# Patient Record
Sex: Female | Born: 1955 | Race: White | Hispanic: No | Smoking: Current every day smoker
Health system: Southern US, Community
[De-identification: ages and names within clinical notes are randomized; demographics above are authoritative.]

## PROBLEM LIST (undated history)

## (undated) DIAGNOSIS — K219 Gastro-esophageal reflux disease without esophagitis: Secondary | ICD-10-CM

## (undated) DIAGNOSIS — E7800 Pure hypercholesterolemia, unspecified: Secondary | ICD-10-CM

## (undated) DIAGNOSIS — C801 Malignant (primary) neoplasm, unspecified: Secondary | ICD-10-CM

## (undated) HISTORY — PX: CERVICAL SPINE SURGERY: SHX589

## (undated) HISTORY — PX: CHOLECYSTECTOMY: SHX55

## (undated) HISTORY — PX: ULNAR NERVE REPAIR: SHX2594

---

## 1997-07-28 HISTORY — PX: ABDOMINAL HYSTERECTOMY: SHX81

## 1997-08-15 ENCOUNTER — Other Ambulatory Visit: Admission: RE | Admit: 1997-08-15 | Discharge: 1997-08-15 | Payer: Self-pay | Admitting: Obstetrics and Gynecology

## 1998-01-17 ENCOUNTER — Ambulatory Visit (HOSPITAL_BASED_OUTPATIENT_CLINIC_OR_DEPARTMENT_OTHER): Admission: RE | Admit: 1998-01-17 | Discharge: 1998-01-17 | Payer: Self-pay | Admitting: Orthopaedic Surgery

## 2000-08-29 ENCOUNTER — Other Ambulatory Visit: Admission: RE | Admit: 2000-08-29 | Discharge: 2000-08-29 | Payer: Self-pay | Admitting: Dermatology

## 2001-02-19 ENCOUNTER — Encounter: Payer: Self-pay | Admitting: Obstetrics and Gynecology

## 2001-02-19 ENCOUNTER — Ambulatory Visit (HOSPITAL_COMMUNITY): Admission: RE | Admit: 2001-02-19 | Discharge: 2001-02-19 | Payer: Self-pay | Admitting: Obstetrics and Gynecology

## 2001-03-13 ENCOUNTER — Ambulatory Visit (HOSPITAL_COMMUNITY): Admission: RE | Admit: 2001-03-13 | Discharge: 2001-03-13 | Payer: Self-pay | Admitting: Obstetrics and Gynecology

## 2001-03-13 ENCOUNTER — Encounter: Payer: Self-pay | Admitting: Obstetrics and Gynecology

## 2002-02-19 ENCOUNTER — Ambulatory Visit (HOSPITAL_COMMUNITY): Admission: RE | Admit: 2002-02-19 | Discharge: 2002-02-19 | Payer: Self-pay | Admitting: Obstetrics and Gynecology

## 2002-02-19 ENCOUNTER — Encounter: Payer: Self-pay | Admitting: Obstetrics and Gynecology

## 2004-03-11 ENCOUNTER — Emergency Department (HOSPITAL_COMMUNITY): Admission: EM | Admit: 2004-03-11 | Discharge: 2004-03-11 | Payer: Self-pay | Admitting: Emergency Medicine

## 2005-06-11 ENCOUNTER — Ambulatory Visit (HOSPITAL_COMMUNITY): Admission: RE | Admit: 2005-06-11 | Discharge: 2005-06-11 | Payer: Self-pay | Admitting: Family Medicine

## 2005-09-24 ENCOUNTER — Ambulatory Visit (HOSPITAL_COMMUNITY): Admission: RE | Admit: 2005-09-24 | Discharge: 2005-09-24 | Payer: Self-pay | Admitting: Family Medicine

## 2006-07-01 ENCOUNTER — Ambulatory Visit (HOSPITAL_COMMUNITY): Admission: RE | Admit: 2006-07-01 | Discharge: 2006-07-01 | Payer: Self-pay | Admitting: Family Medicine

## 2006-07-05 ENCOUNTER — Ambulatory Visit (HOSPITAL_COMMUNITY): Admission: RE | Admit: 2006-07-05 | Discharge: 2006-07-05 | Payer: Self-pay | Admitting: Family Medicine

## 2006-07-17 ENCOUNTER — Inpatient Hospital Stay (HOSPITAL_COMMUNITY): Admission: RE | Admit: 2006-07-17 | Discharge: 2006-07-18 | Payer: Self-pay | Admitting: Neurosurgery

## 2007-06-15 ENCOUNTER — Ambulatory Visit (HOSPITAL_COMMUNITY): Admission: RE | Admit: 2007-06-15 | Discharge: 2007-06-15 | Payer: Self-pay | Admitting: Otolaryngology

## 2007-07-09 ENCOUNTER — Ambulatory Visit (HOSPITAL_COMMUNITY): Admission: RE | Admit: 2007-07-09 | Discharge: 2007-07-09 | Payer: Self-pay | Admitting: Family Medicine

## 2007-07-20 ENCOUNTER — Ambulatory Visit (HOSPITAL_COMMUNITY): Admission: RE | Admit: 2007-07-20 | Discharge: 2007-07-20 | Payer: Self-pay | Admitting: Family Medicine

## 2007-08-02 ENCOUNTER — Emergency Department (HOSPITAL_COMMUNITY): Admission: EM | Admit: 2007-08-02 | Discharge: 2007-08-02 | Payer: Self-pay | Admitting: Emergency Medicine

## 2008-06-07 ENCOUNTER — Ambulatory Visit (HOSPITAL_COMMUNITY): Admission: RE | Admit: 2008-06-07 | Discharge: 2008-06-07 | Payer: Self-pay | Admitting: Family Medicine

## 2008-08-01 ENCOUNTER — Emergency Department (HOSPITAL_COMMUNITY): Admission: EM | Admit: 2008-08-01 | Discharge: 2008-08-01 | Payer: Self-pay | Admitting: Emergency Medicine

## 2008-10-27 HISTORY — PX: COLONOSCOPY: SHX174

## 2008-11-08 ENCOUNTER — Ambulatory Visit (HOSPITAL_COMMUNITY): Admission: RE | Admit: 2008-11-08 | Discharge: 2008-11-08 | Payer: Self-pay | Admitting: Family Medicine

## 2008-11-11 ENCOUNTER — Encounter: Payer: Self-pay | Admitting: Gastroenterology

## 2008-11-11 ENCOUNTER — Ambulatory Visit (HOSPITAL_COMMUNITY): Admission: RE | Admit: 2008-11-11 | Discharge: 2008-11-11 | Payer: Self-pay | Admitting: Family Medicine

## 2008-11-15 ENCOUNTER — Telehealth (INDEPENDENT_AMBULATORY_CARE_PROVIDER_SITE_OTHER): Payer: Self-pay

## 2008-11-21 ENCOUNTER — Encounter: Payer: Self-pay | Admitting: Gastroenterology

## 2008-11-21 ENCOUNTER — Ambulatory Visit (HOSPITAL_COMMUNITY): Admission: RE | Admit: 2008-11-21 | Discharge: 2008-11-21 | Payer: Self-pay | Admitting: Gastroenterology

## 2008-11-21 ENCOUNTER — Ambulatory Visit: Payer: Self-pay | Admitting: Gastroenterology

## 2009-01-26 ENCOUNTER — Ambulatory Visit (HOSPITAL_COMMUNITY): Admission: RE | Admit: 2009-01-26 | Discharge: 2009-01-26 | Payer: Self-pay | Admitting: Family Medicine

## 2009-02-06 ENCOUNTER — Inpatient Hospital Stay (HOSPITAL_COMMUNITY): Admission: RE | Admit: 2009-02-06 | Discharge: 2009-02-09 | Payer: Self-pay | Admitting: General Surgery

## 2009-02-06 ENCOUNTER — Encounter (INDEPENDENT_AMBULATORY_CARE_PROVIDER_SITE_OTHER): Payer: Self-pay | Admitting: General Surgery

## 2010-03-01 ENCOUNTER — Ambulatory Visit (HOSPITAL_COMMUNITY): Admission: RE | Admit: 2010-03-01 | Payer: Self-pay | Admitting: Family Medicine

## 2010-05-19 ENCOUNTER — Encounter: Payer: Self-pay | Admitting: Family Medicine

## 2010-08-02 LAB — HEPATIC FUNCTION PANEL
ALT: 27 U/L (ref 0–35)
AST: 33 U/L (ref 0–37)
Bilirubin, Direct: 0.2 mg/dL (ref 0.0–0.3)
Indirect Bilirubin: 0.6 mg/dL (ref 0.3–0.9)
Total Bilirubin: 0.8 mg/dL (ref 0.3–1.2)
Total Protein: 6 g/dL (ref 6.0–8.3)

## 2010-08-02 LAB — CBC
Hemoglobin: 13 g/dL (ref 12.0–15.0)
MCV: 89.3 fL (ref 78.0–100.0)
Platelets: 148 10*3/uL — ABNORMAL LOW (ref 150–400)

## 2010-08-02 LAB — BASIC METABOLIC PANEL
BUN: 8 mg/dL (ref 6–23)
CO2: 24 mEq/L (ref 19–32)
Creatinine, Ser: 0.59 mg/dL (ref 0.4–1.2)
GFR calc Af Amer: >60 mL/min (ref >60)
GFR calc non Af Amer: >60 mL/min (ref >60)
Glucose, Bld: 97 mg/dL (ref 70–99)
Sodium: 138 mEq/L (ref 135–145)

## 2010-08-02 LAB — DIFFERENTIAL
Basophils Relative: 0 % (ref 0–1)
Lymphocytes Relative: 10 % — ABNORMAL LOW (ref 12–46)
Monocytes Absolute: 0.7 10*3/uL (ref 0.1–1.0)
Neutro Abs: 8.4 10*3/uL — ABNORMAL HIGH (ref 1.7–7.7)

## 2010-09-11 NOTE — Op Note (Signed)
NAME:  Tara Wong, Tara Wong NO.:  [id number]   MEDICAL RECORD NO.:  [id number]          PATIENT TYPE:  AMB   LOCATION:  DAY                           FACILITY:  [facility]   PHYSICIAN:  Kassie Mends, M.D.      DATE OF BIRTH:  1956/03/23   DATE OF PROCEDURE:  11/21/2008  DATE OF DISCHARGE:                                PROCEDURE NOTE   REFERRING Sally Reimers:  Tara Bernard, MD   PROCEDSURE:  Colonoscopy with cold forceps polypectomy   INDICATION FOR EXAM:  Tara Wong is a 55 year old female who presents  with intermittent left lower quadrant pain and constipation.  She never  had a colonoscopy.   FINDINGS:  1. A 3-mm sessile rectal polyp removed via cold forceps.  Otherwise,      no polyps, masses, inflammatory changes, diverticula, or AVMs.  2. Moderate internal hemorrhoids.  Otherwise, normal retroflex view of      the rectum.   RECOMMENDATIONS:  1. Screening colonoscopy in 10 years if she has hyperplastic polyp or      simple adenoma.  2. She should follow a high-fiber diet.  She is given a handout on      high-fiber diet, polyps and hemorrhoids.  She should drink 6-8 cups      of water daily.  3. No aspirin, NSAIDs, or anticoagulation for 5 days.  4. If she continues to have problems with abdominal pain and      constipation, then we would be more than happy to see her in the      office for an evaluation.  5. Will call her with the results of her biopsy.   MEDICATIONS:  1. Demerol 75 mg IV.  2. Versed 5 mg IV.   PROCEDURE TECHNIQUE:  Physical exam was performed.  Informed consent was  obtained from the patient after explaining the benefits, risks and  alternatives to the procedure.  The patient was clinically monitored and  placed in left lateral position.  Continuous oxygen was provided by  nasal cannula, IV medicine administered through an indwelling cannula.  After administration of sedation and rectal exam, the patient's rectum  was intubated and the  scope was advanced under direct stabilization to  the cecum.  The scope was removed slowly by carefully examining  the color, texture, anatomy and integrity of the mucosa on the way out.  The patient was recovered in endoscopy and discharged home in  satisfactory condition.   PATH:  HYPERPLASTIC POLYPS      Kassie Mends, M.D.  Electronically Signed     SM/MEDQ  D:  11/21/2008  T:  11/21/2008  Job:  [id number]   cc:   Tara Wong, M.D.  Fax: [phone]

## 2010-09-14 NOTE — Op Note (Signed)
NAMEMAELYS, Tara Wong NO.:  [id number]   MEDICAL RECORD NO.:  [id number]          PATIENT TYPE:  INP   LOCATION:  2899                         FACILITY:  [facility]   PHYSICIAN:  Danae Orleans. Venetia Maxon, M.D.  DATE OF BIRTH:  January 16, 1956   DATE OF PROCEDURE:  07/17/2006  DATE OF DISCHARGE:                               OPERATIVE REPORT   PREOPERATIVE DIAGNOSIS:  1. Herniated cervical disk with spondylosis.  2. Degenerative disease.  3. Radiculopathy C5-6 and C6-7 levels.   POSTOPERATIVE DIAGNOSIS:  1. Herniated cervical disk with spondylosis.  2. Degenerative disease.  3. Radiculopathy C5-6 and C6-7 levels.   PROCEDURE:  Anterior cervical decompression and fusion C5-6 and C6-7  levels with PEEK interbody cages and morcellized bone autograft,  anterior cervical plate.   SURGEON:  Danae Orleans. Venetia Maxon, M.D.   ASSISTANT:  Clydene Fake, M.D.   ANESTHESIA:  General endotracheal anesthesia.   BLOOD LOSS:  Minimal.   COMPLICATIONS:  None.   DISPOSITION:  Recovery.   INDICATIONS:  Joslynn Jamroz is a 55 year old woman with significant left  arm pain and weakness with a large amount of spondylitic foraminal  stenosis and canal compromise of the C5-6 and C6-7 levels.  It was  elected take her to surgery for anterior cervical decompression and  fusion.   DESCRIPTION OF PROCEDURE:  Ms. Cressler was brought to the operating  room.  Following the satisfactory and uncomplicated induction of general  endotracheal anesthesia, and placement of intravenous lines, the patient  was placed in the supine position on the operating table.  Her neck was  placed in slight extension.  She was placed in 5 pounds of halter  traction.  Her anterior neck was then prepped and draped in the usual  sterile fashion using DuraPrep.  Area of planned incision was  infiltrated with 1/4% Marcaine and lidocaine and 1:200,000 epinephrine  and incision was made from the midline to the anterior border of  the  sternocleidomastoid muscle.  It was carried sharply through platysma  layer.  Subplatysmal dissection was performed exposing the anterior  border of the sternocleidomastoid muscle.  Using blunt dissection the  carotid sheath was kept lateral and trachea and esophagus kept medial  exposing the anterior cervical spine.  A bent spinal needle was placed  subsequently at the C5-6 level and this was confirmed on intraoperative  x-ray.   Subsequently the longus colli muscles were taken down from the anterior  cervical spine from C5-C7 using electrocautery and Key elevator.  A self-  retaining shadow line retractor was placed facilitating exposure along  with up-and-down retractor.  Subsequently the interspace at C5-6 and C6-  7 were incised.  Degenerated osteophytic material was removed.  The disk  space was cleared of disk material.  The endplates were then  decorticated with a high-speed drill as were the uncinate spurs  bilaterally. The bone removed with these maneuvers were saved for later  use in bone grafting.  Initially at the C5-6 level there was a large  osteophyte which was directly compressing the spinal cord dura.  This  was  very carefully decompressed; and the osteophytic material was  removed.  There was significant foraminal compression of the C6 nerve  root on the left which had a relatively vertical take off.  The nerve  root was decompressed as was the right-sided nerve root.  Hemostasis was  assured with Gelfoam-soaked thrombin; and after a trial sizing a medium  6-mm PEEK interbody cage was selected, packed with morcellized bone  autograft, inserted in the interspace, and countersunk appropriately.   Attention was then turned to the C6-7 level where similar decompression  was performed.  Again the bone removed was saved for later use in bone  grafting.  The endplates were decorticated and uncinate spurs drilled  down.  Subsequently using a 2-and-3-mm millimeter gold  tip Kerrison  rongeurs.  Both the cervical and spinal cord dura as well as the C7  nerve roots were decompressed.  Hemostasis, again, assured and  subsequently a 7-mm PEEK interbody cage was selected, packed with  morcellized bone autograft, inserted in the interspace, and countersunk  appropriately.   A 32-mm trestle, anterior cervical plate was then affixed to the  anterior cervical spine using variable angle 14-mm screws; two at C5,  two at C6, and two at C7.  All screws had excellent purchase.  Locking  mechanisms were engaged.  Final x-ray demonstrated a well-positioned  interbody grafts in the anterior cervical plate.  The wound was  copiously irrigated with bacitracin saline.  Soft tissues were inspected  and found to be in good repair.  Hemostasis was assured.   The platysma layer was closed with 3-0 Vicryl sutures.  The skin edges  were approximated with 3-0 Vicryl interrupted inverted sutures.  The  wound was dressed with Dermabond.  The patient was x-rayed in the  operating room and taken to the recovery room in stable satisfactory  condition; having tolerated the operation well.  Counts were correct at  the end of the case.      Danae Orleans. Venetia Maxon, M.D.  Electronically Signed     JDS/MEDQ  D:  07/17/2006  T:  07/17/2006  Job:  [id number]

## 2012-06-02 ENCOUNTER — Ambulatory Visit (HOSPITAL_COMMUNITY)
Admission: RE | Admit: 2012-06-02 | Discharge: 2012-06-02 | Disposition: A | Discharge status: Home / Self Care | Payer: BC Managed Care – PPO | Source: Ambulatory Visit | Attending: Family Medicine | Admitting: Family Medicine

## 2012-06-02 ENCOUNTER — Other Ambulatory Visit: Payer: Self-pay | Admitting: Family Medicine

## 2012-06-02 DIAGNOSIS — F17200 Nicotine dependence, unspecified, uncomplicated: Secondary | ICD-10-CM

## 2012-06-02 DIAGNOSIS — R079 Chest pain, unspecified: Secondary | ICD-10-CM | POA: Insufficient documentation

## 2012-06-25 ENCOUNTER — Encounter (HOSPITAL_COMMUNITY): Payer: Self-pay | Admitting: *Deleted

## 2012-06-25 ENCOUNTER — Emergency Department (HOSPITAL_COMMUNITY)
Admission: EM | Admit: 2012-06-25 | Discharge: 2012-06-25 | Disposition: A | Discharge status: Home / Self Care | Payer: BC Managed Care – PPO | Attending: Emergency Medicine | Admitting: Emergency Medicine

## 2012-06-25 DIAGNOSIS — K1120 Sialoadenitis, unspecified: Secondary | ICD-10-CM | POA: Insufficient documentation

## 2012-06-25 DIAGNOSIS — H00014 Hordeolum externum left upper eyelid: Secondary | ICD-10-CM

## 2012-06-25 DIAGNOSIS — Z8719 Personal history of other diseases of the digestive system: Secondary | ICD-10-CM | POA: Insufficient documentation

## 2012-06-25 DIAGNOSIS — H00019 Hordeolum externum unspecified eye, unspecified eyelid: Secondary | ICD-10-CM | POA: Insufficient documentation

## 2012-06-25 DIAGNOSIS — F17200 Nicotine dependence, unspecified, uncomplicated: Secondary | ICD-10-CM | POA: Insufficient documentation

## 2012-06-25 HISTORY — DX: Malignant (primary) neoplasm, unspecified: C80.1

## 2012-06-25 HISTORY — DX: Gastro-esophageal reflux disease without esophagitis: K21.9

## 2012-06-25 MED ORDER — AMOXICILLIN-POT CLAVULANATE 875-125 MG PO TABS: 1.0000 | ORAL_TABLET | Freq: Two times a day (BID) | ORAL | Status: DC

## 2012-06-25 NOTE — ED Notes (Addendum)
Swelling , tenderness rt side of face,  Started z-pack and eye drops today for a stye.  Tingling of lips

## 2012-06-25 NOTE — ED Provider Notes (Signed)
History     CSN: [id number]  Arrival date & time 06/25/12  1652   First MD Initiated Contact with Patient 06/25/12 2007      Chief Complaint  Patient presents with  . Facial Swelling   HPI Pt was seen at 2025.  Per pt, c/o gradual onset and persistence of waxing and waning right sided face "pain" and "swelling" that began tonight PTA.  Pt states her symptoms began while she was eating.  States "it just started to swell."  States the swelling improved shortly after she stopped eating, and began again when she ate a snack while in the ED waiting room.  Pt states the swelling is starting to improve now.  States she was eval by her PMD Luking today for a "stye" on her left eyelid, started on zithromax and eye drops.  Pt is concerned that this may be a medication reaction.  Denies SOB/wheezing, no sore throat, no intra-oral edema, no intra-oral drainage/swelling, no fevers, no facial rash, no body rash, no eye pain, no eye drainage.     Past Medical History  Diagnosis Date  . GERD (gastroesophageal reflux disease)     Past Surgical History  Procedure Laterality Date  . Abdominal hysterectomy    . Cervical spine surgery    . Cholecystectomy      History  Substance Use Topics  . Smoking status: Current Every Day Smoker  . Smokeless tobacco: Not on file  . Alcohol Use: No      Review of Systems ROS: Statement: All systems negative except as marked or noted in the HPI; Constitutional: Negative for fever and chills. ; ; Eyes: Negative for eye pain, redness and discharge. ; ; ENMT: Negative for ear pain, hoarseness, nasal congestion, sinus pressure and sore throat. ; ; Cardiovascular: Negative for chest pain, palpitations, diaphoresis, dyspnea and peripheral edema. ; ; Respiratory: Negative for cough, wheezing and stridor. ; ; Gastrointestinal: Negative for nausea, vomiting, diarrhea, abdominal pain, blood in stool, hematemesis, jaundice and rectal bleeding. . ; ; Genitourinary: Negative  for dysuria, flank pain and hematuria. ; ; Musculoskeletal: Negative for back pain and neck pain. Negative for swelling and trauma.; ; Skin: +right face swelling. Negative for pruritus, rash, abrasions, blisters, bruising and skin lesion.; ; Neuro: Negative for headache, lightheadedness and neck stiffness. Negative for weakness, altered level of consciousness , altered mental status, extremity weakness, paresthesias, involuntary movement, seizure and syncope.       Allergies  Review of patient's allergies indicates no known allergies.  Home Medications   Current Outpatient Rx  Name  Route  Sig  Dispense  Refill  . amoxicillin-clavulanate (AUGMENTIN) 875-125 MG per tablet   Oral   Take 1 tablet by mouth every 12 (twelve) hours.   20 tablet   0     BP 122/68  Pulse 81  Temp(Src) 98.2 F (36.8 C) (Oral)  Resp 20  Ht 5\' 8"  (1.727 m)  Wt 152 lb (68.947 kg)  BMI 23.12 kg/m2  SpO2 100%  Physical Exam 2030: Physical examination:  Nursing notes reviewed; Vital signs and O2 SAT reviewed;  Constitutional: Well developed, Well nourished, Well hydrated, In no acute distress; Head:  Normocephalic, atraumatic; Eyes: EOMI, PERRL, No scleral icterus. No conjunctival injection, no drainage. +small hordeolum left upper eyelid margin; ENMT: TM's clear bilat. +edemetous nasal turbinates bilat with clear rhinorrhea. +right parotid area with localized mild edema and tenderness to palp. No overlying erythema. No intra-oral drainage from right duct (spontaneous or able  to be manually expressed). Mouth and pharynx without lesions. No tonsillar exudates. No intra-oral edema. No submandibular or sublingual edema. No hoarse voice, no drooling, no stridor. No pain with manipulation of larynx. Mouth and pharynx normal, Mucous membranes moist; Neck: Supple, Full range of motion, No lymphadenopathy; Cardiovascular: Regular rate and rhythm, No murmur, rub, or gallop; Respiratory: Breath sounds clear & equal  bilaterally, No rales, rhonchi, wheezes.  Speaking full sentences with ease, Normal respiratory effort/excursion; Chest: Nontender, Movement normal; Abdomen: Soft, Nontender, Nondistended, Normal bowel sounds;; Extremities: Pulses normal, No tenderness, No edema, No calf edema or asymmetry.; Neuro: AA&Ox3, Major CN grossly intact. No facial droop.  Speech clear. Climbs on and off stretcher easily by herself. No gross focal motor or sensory deficits in extremities.; Skin: Color normal, Warm, Dry, no rash, no hives, no petechiae.   ED Course  Procedures     MDM  MDM Reviewed: nursing note and vitals    2100:  Pt states symptoms began, and reoccur, when she eats.  Right parotid mildly edematous with localized TTP.  No facial erythema.  No drainage or obvious salivary stone able to be expressed from inside right cheek. Afebrile with stable VS.  Appears localized parotitis/sialadenitis, will have pt d/c zithromax and tx with augmentin. Pt and family concerned this is "a reaction to the zithromax."  No hives, no rash, no intra-oral edema.  Reassured.  Pt and family want to go home now.  Pt will continue her eye meds for left lid hordeolum.  Dx d/w pt and family.  Questions answered.  Verb understanding, agreeable to d/c home with outpt f/u.        Laray Anger, DO 06/28/12 1739

## 2012-06-26 ENCOUNTER — Encounter (HOSPITAL_COMMUNITY): Payer: Self-pay | Admitting: Emergency Medicine

## 2012-07-15 ENCOUNTER — Encounter: Payer: Self-pay | Admitting: *Deleted

## 2012-07-15 DIAGNOSIS — E785 Hyperlipidemia, unspecified: Secondary | ICD-10-CM | POA: Insufficient documentation

## 2012-07-15 DIAGNOSIS — R32 Unspecified urinary incontinence: Secondary | ICD-10-CM

## 2012-07-15 DIAGNOSIS — K219 Gastro-esophageal reflux disease without esophagitis: Secondary | ICD-10-CM | POA: Insufficient documentation

## 2012-07-16 ENCOUNTER — Ambulatory Visit (INDEPENDENT_AMBULATORY_CARE_PROVIDER_SITE_OTHER): Payer: BC Managed Care – PPO | Admitting: Nurse Practitioner

## 2012-07-16 ENCOUNTER — Encounter: Payer: Self-pay | Admitting: Nurse Practitioner

## 2012-07-16 VITALS — BP 105/70 | Temp 98.2°F | Wt 154.0 lb

## 2012-07-16 DIAGNOSIS — L309 Dermatitis, unspecified: Secondary | ICD-10-CM

## 2012-07-16 MED ORDER — CLOTRIMAZOLE-BETAMETHASONE 1-0.05 % EX CREA: TOPICAL_CREAM | Freq: Two times a day (BID) | CUTANEOUS | Status: DC

## 2012-07-16 NOTE — Progress Notes (Signed)
Subjective: Presents complaints of a flareup of her rash on her arms. Has had a similar problem in the past in 2011. No changes in her hygiene products. Pruritic at times. No known allergens. No fever. No other rash. Objective: NAD. Alert, oriented. Several discrete slightly raised dry faint pink patches noted in the axillary area. Skin in general is slightly dry. Assessment: #1 eczema #2 possible secondary fungal infection Plan: #1 Lotrisone cream twice a day to affected area when necessary up to 2 weeks. Call back if worsens or persists. [facility]

## 2012-07-16 NOTE — Patient Instructions (Signed)
Recommend Cetaphil cream for dry skin.  Call back if rash worsens or persists.

## 2012-08-17 ENCOUNTER — Ambulatory Visit: Payer: BC Managed Care – PPO | Admitting: Family Medicine

## 2013-02-18 ENCOUNTER — Ambulatory Visit (INDEPENDENT_AMBULATORY_CARE_PROVIDER_SITE_OTHER): Payer: BC Managed Care – PPO | Admitting: *Deleted

## 2013-02-18 DIAGNOSIS — Z23 Encounter for immunization: Secondary | ICD-10-CM

## 2013-04-25 IMAGING — CR DG CHEST 2V
2 series · 2 of 2 positions shown · non-contrast
Comparison: Chest x-ray of 11/08/2008

CLINICAL DATA: Left-sided chest pain, smoking history

CHEST - 2 VIEW

[view not recorded (1 of 2)]
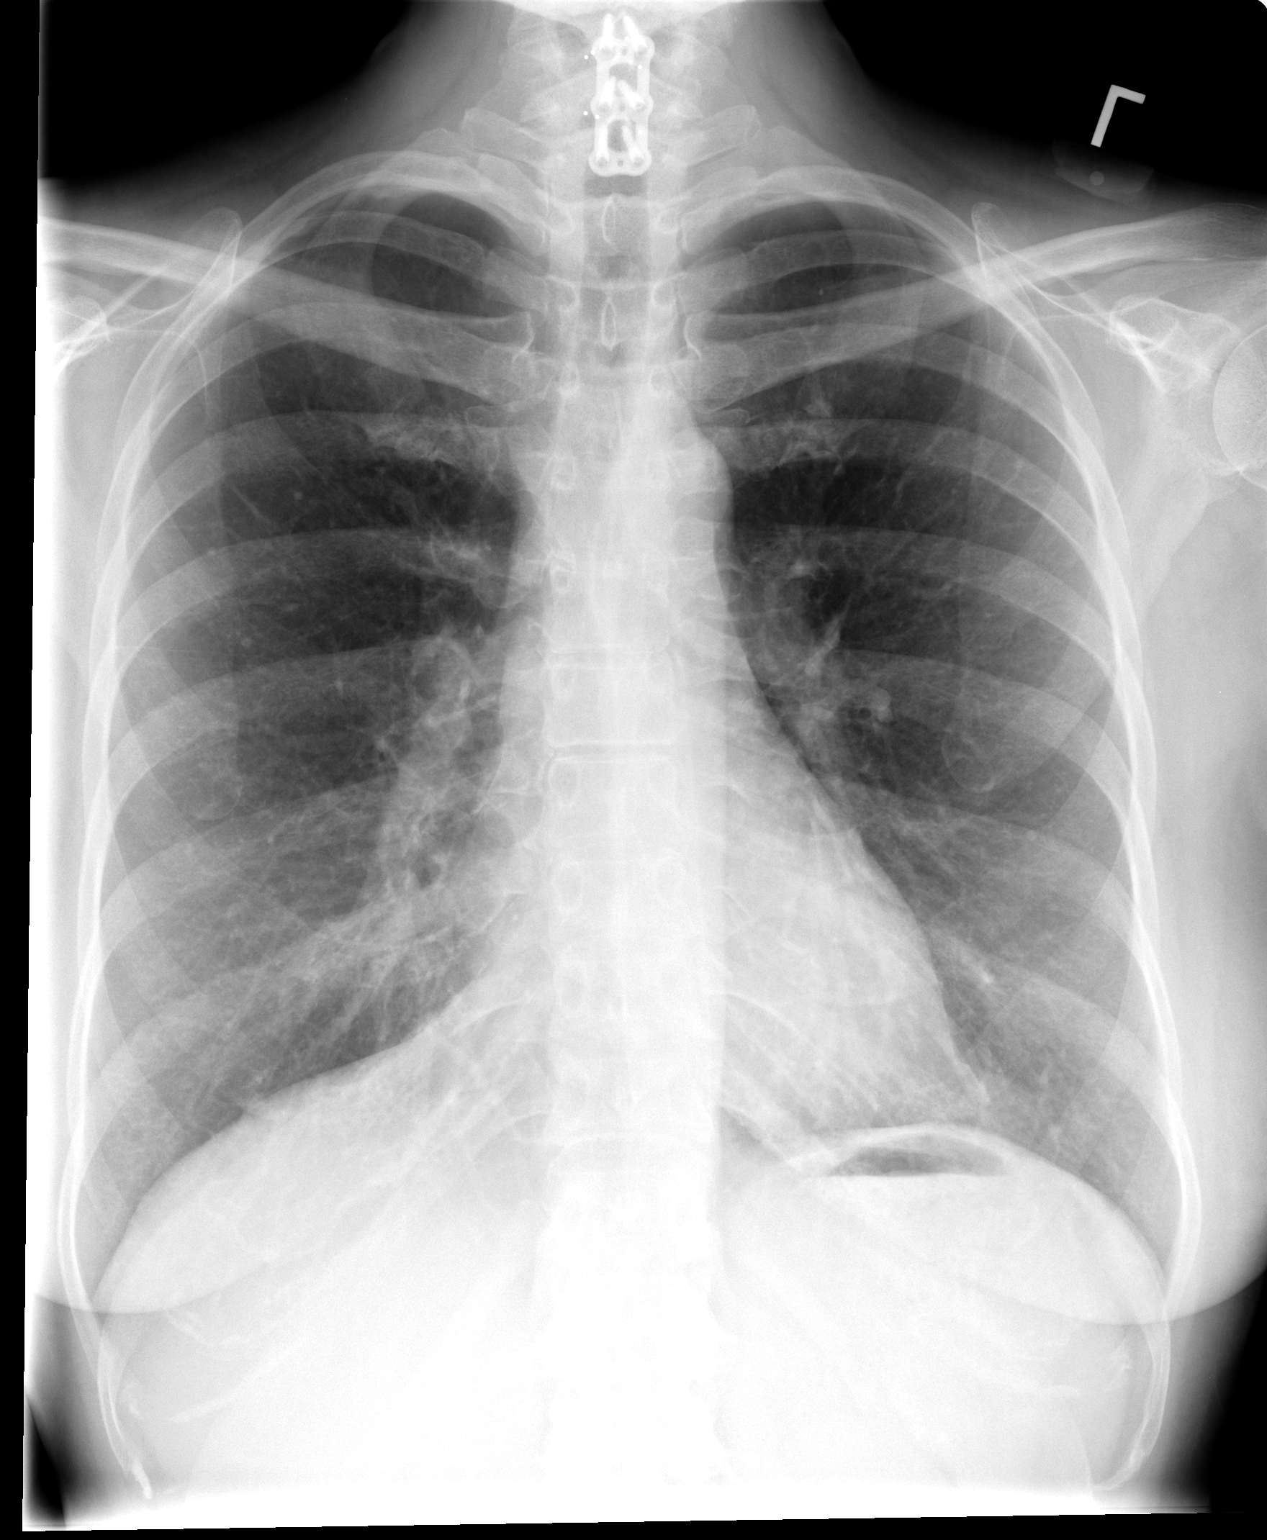

[view not recorded (2 of 2)]
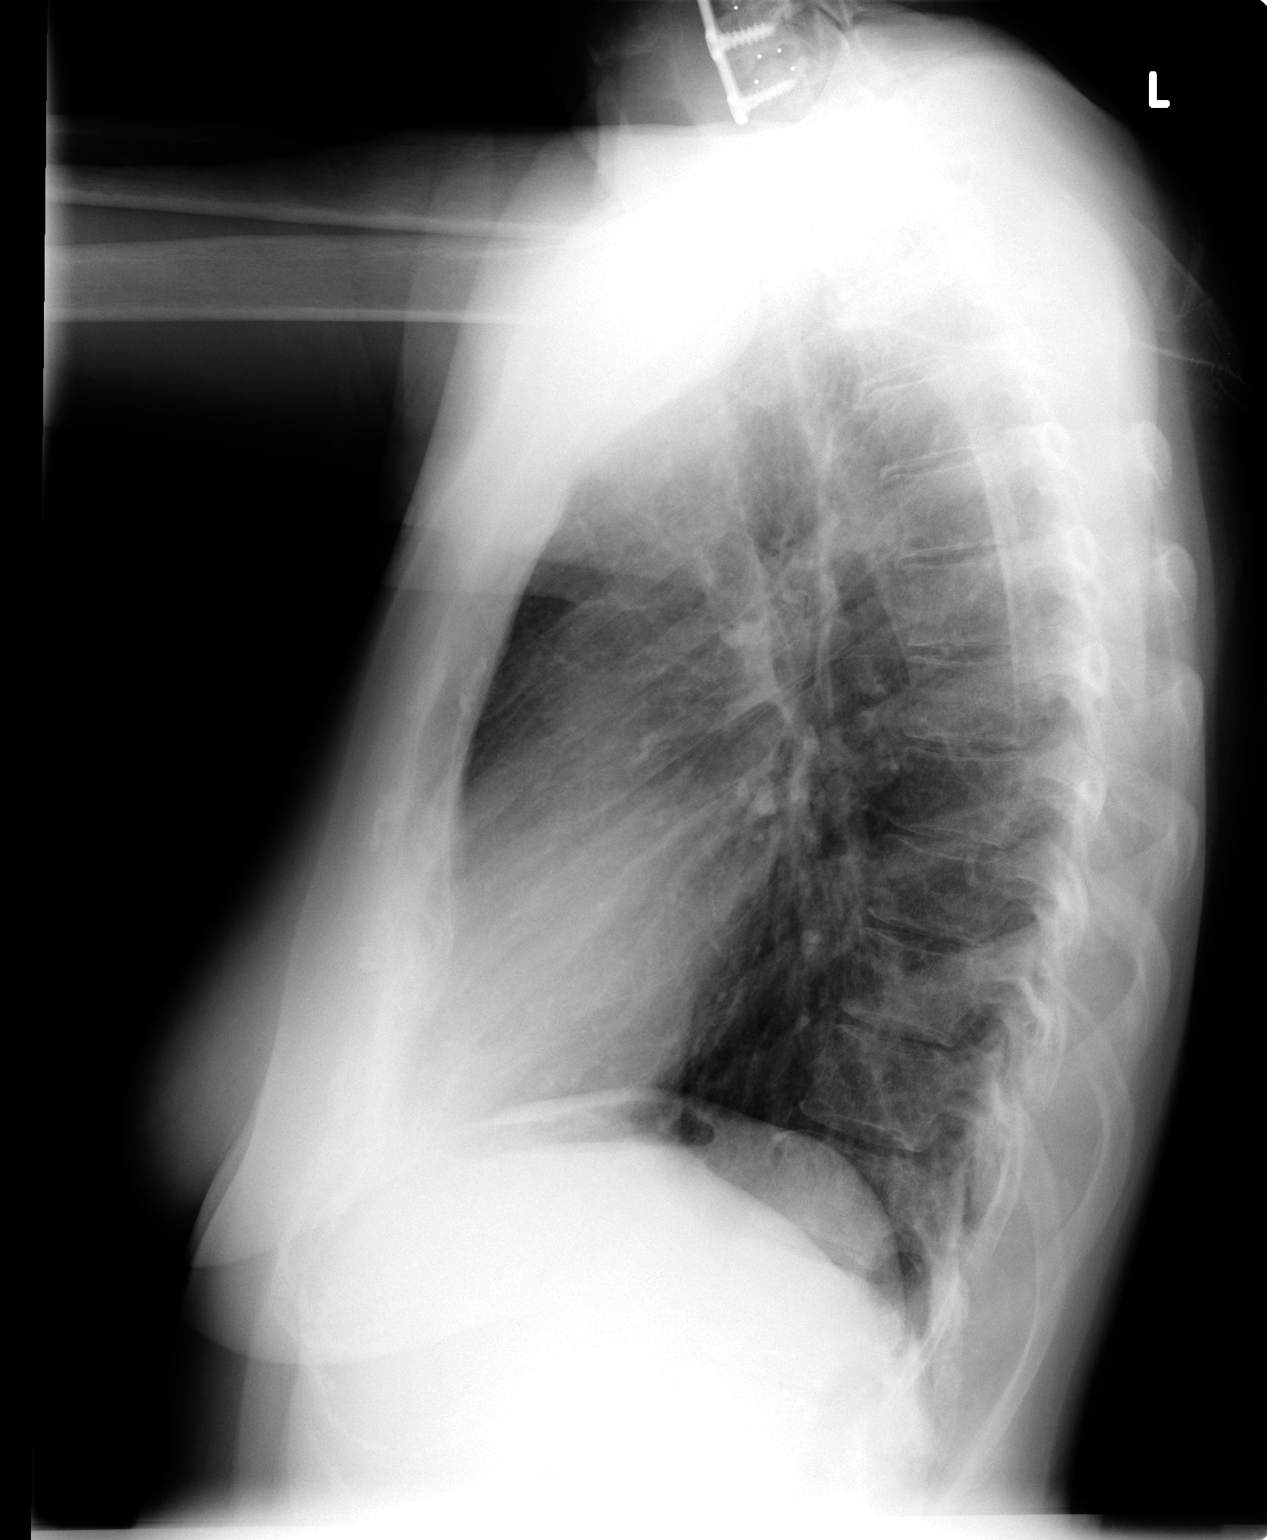

[2 of 2 positions shown; findings below may reference images not displayed]

FINDINGS: No active infiltrate or effusion is seen.  Mediastinal
contours appear normal.  Mild peribronchial thickening is seen
which may indicate bronchitis in this patient with smoking history.
The heart is within normal limits in size.  No skeletal abnormality
is noted.  A lower anterior cervical spine fusion plate is present.
IMPRESSION: No active lung disease.  Mild peribronchial thickening.

## 2013-06-02 ENCOUNTER — Encounter: Payer: Self-pay | Admitting: Family Medicine

## 2013-06-02 ENCOUNTER — Ambulatory Visit (INDEPENDENT_AMBULATORY_CARE_PROVIDER_SITE_OTHER): Payer: BC Managed Care – PPO | Admitting: Family Medicine

## 2013-06-02 VITALS — BP 100/70 | Temp 98.5°F | Ht 68.0 in | Wt 164.0 lb

## 2013-06-02 DIAGNOSIS — J329 Chronic sinusitis, unspecified: Secondary | ICD-10-CM

## 2013-06-02 DIAGNOSIS — J310 Chronic rhinitis: Secondary | ICD-10-CM

## 2013-06-02 MED ORDER — AMOXICILLIN-POT CLAVULANATE 875-125 MG PO TABS: 1.0000 | ORAL_TABLET | Freq: Two times a day (BID) | ORAL | Status: AC

## 2013-06-02 NOTE — Progress Notes (Signed)
   Subjective:    Patient ID: Tara Wong, female    DOB: 04-06-1956, 58 y.o.   MRN: [id number]  URI  This is a new problem. The current episode started in the past 7 days. The problem has been unchanged. Maximum temperature: low grade fever. Associated symptoms include congestion, coughing, diarrhea, headaches and a sore throat. Associated symptoms comments: Body aches. She has tried NSAIDs and decongestant for the symptoms. The treatment provided no relief.   Head aching sinus area  Felt like had fever  Body aches appetite ok  Tired   Some diearrhea  Sore throat intermittently   Review of Systems  HENT: Positive for congestion and sore throat.   Respiratory: Positive for cough.   Gastrointestinal: Positive for diarrhea.  Neurological: Positive for headaches.       Objective:   Physical Exam  Alert moderate malaise. H&T moderate his congestion frontal tenderness pharynx normal neck supple lungs some rhonchi no wheezes heart rare in rhythm.      Assessment & Plan:  In impression post viral rhinosinusitis and bronchitis plan Augmentin twice a day 10 days. Symptomatic care discussed. Warning signs discussed. WSL

## 2013-08-05 ENCOUNTER — Other Ambulatory Visit: Payer: Self-pay | Admitting: Family Medicine

## 2013-08-07 ENCOUNTER — Other Ambulatory Visit: Payer: Self-pay | Admitting: Family Medicine

## 2013-08-13 ENCOUNTER — Other Ambulatory Visit: Payer: Self-pay | Admitting: Family Medicine

## 2013-08-15 ENCOUNTER — Other Ambulatory Visit: Payer: Self-pay | Admitting: Family Medicine

## 2013-11-21 ENCOUNTER — Other Ambulatory Visit: Payer: Self-pay | Admitting: Family Medicine

## 2013-11-30 ENCOUNTER — Ambulatory Visit (INDEPENDENT_AMBULATORY_CARE_PROVIDER_SITE_OTHER): Payer: BC Managed Care – PPO | Admitting: Family Medicine

## 2013-11-30 ENCOUNTER — Encounter: Payer: Self-pay | Admitting: Family Medicine

## 2013-11-30 VITALS — BP 112/74 | Ht 68.0 in | Wt 167.0 lb

## 2013-11-30 DIAGNOSIS — K219 Gastro-esophageal reflux disease without esophagitis: Secondary | ICD-10-CM

## 2013-11-30 MED ORDER — OMEPRAZOLE 40 MG PO CPDR: 40.0000 mg | DELAYED_RELEASE_CAPSULE | Freq: Every day | ORAL | Status: DC

## 2013-11-30 NOTE — Progress Notes (Signed)
   Subjective:    Patient ID: Tara Wong, female    DOB: 09-Jul-1955, 58 y.o.   MRN: [id number]  Gastrophageal Reflux This is a chronic problem. The current episode started more than 1 year ago. Treatments tried: omeprazole 40mg . The treatment provided significant relief.   Pt states no concerns today. Has cut caffeine out  Still smoking unfortunately  Takes the ppi daily, needs it every day, takes two some da  Review of Systems No headache no chest pain no back pain no change in bowel habits no blood in stool    Objective:   Physical Exam  Alert no apparent distress. Lungs clear. Heart regular in rhythm. H&T normal. Epigastrium no significant tenderness.      Assessment & Plan:  Impression chronic reflux. Plan patient encouraged to stop smoking. Maintain same meds. Diet discussed. WSL

## 2014-02-03 ENCOUNTER — Ambulatory Visit (INDEPENDENT_AMBULATORY_CARE_PROVIDER_SITE_OTHER): Payer: BC Managed Care – PPO | Admitting: *Deleted

## 2014-02-03 DIAGNOSIS — Z23 Encounter for immunization: Secondary | ICD-10-CM

## 2014-02-10 ENCOUNTER — Ambulatory Visit (INDEPENDENT_AMBULATORY_CARE_PROVIDER_SITE_OTHER): Payer: BC Managed Care – PPO | Admitting: Nurse Practitioner

## 2014-02-10 VITALS — BP 110/78 | Ht 68.0 in | Wt 170.0 lb

## 2014-02-10 DIAGNOSIS — M62838 Other muscle spasm: Secondary | ICD-10-CM

## 2014-02-10 DIAGNOSIS — M6248 Contracture of muscle, other site: Secondary | ICD-10-CM

## 2014-02-10 MED ORDER — CHLORZOXAZONE 500 MG PO TABS: 500.0000 mg | ORAL_TABLET | Freq: Three times a day (TID) | ORAL | Status: DC | PRN

## 2014-02-10 NOTE — Patient Instructions (Signed)
Icy Hot Smart Relief TENS unit Massage therapy

## 2014-02-11 ENCOUNTER — Encounter: Payer: Self-pay | Admitting: Nurse Practitioner

## 2014-02-11 NOTE — Progress Notes (Signed)
Subjective:  Presents complaints of pain in the upper back/right shoulder area for over a week. No specific history of injury. Has tried a new pillow and topical OTC medicine with minimal relief. Pain noted in this area with certain movements of the right shoulder. No numbness or weakness in the right arm. Pain is localized to affected area.  Objective:   BP 110/78  Ht 5\' 8"  (1.727 m)  Wt 170 lb (77.111 kg)  BMI 25.85 kg/m2 NAD. Alert, oriented. Lungs clear. Heart regular rate rhythm. Good active ROM of the neck and right shoulder. Movement produces pain in the right trapezius and rhomboid. No tenderness with palpation of the cervical area or right shoulder joint line. Hand and arm strength 5+ bilateral. Sensation grossly intact. Strong radial pulse.  Assessment: Muscle spasms of neck  Plan:  Meds ordered this encounter  Medications  . chlorzoxazone (PARAFON) 500 MG tablet    Sig: Take 1 tablet (500 mg total) by mouth 3 (three) times daily as needed for muscle spasms.    Dispense:  30 tablet    Refill:  0    Order Specific Question:  Supervising Provider    Answer:  Mikey Kirschner [[id number]]  Icy Hot Smart Relief TENS unit Massage therapy Gentle stretching. Anti-inflammatories as directed with food. Call back in 10-14 days if no improvement, sooner if worse.

## 2014-05-23 ENCOUNTER — Other Ambulatory Visit: Payer: Self-pay | Admitting: Family Medicine

## 2014-08-23 ENCOUNTER — Other Ambulatory Visit: Payer: Self-pay | Admitting: Family Medicine

## 2014-09-04 ENCOUNTER — Other Ambulatory Visit: Payer: Self-pay | Admitting: Family Medicine

## 2014-11-30 ENCOUNTER — Other Ambulatory Visit: Payer: Self-pay | Admitting: Family Medicine

## 2014-12-30 ENCOUNTER — Other Ambulatory Visit: Payer: Self-pay | Admitting: Family Medicine

## 2015-01-24 ENCOUNTER — Other Ambulatory Visit: Payer: Self-pay | Admitting: *Deleted

## 2015-01-24 MED ORDER — OMEPRAZOLE 40 MG PO CPDR: DELAYED_RELEASE_CAPSULE | ORAL | Status: DC

## 2015-01-30 ENCOUNTER — Ambulatory Visit (INDEPENDENT_AMBULATORY_CARE_PROVIDER_SITE_OTHER): Payer: BLUE CROSS/BLUE SHIELD | Admitting: Family Medicine

## 2015-01-30 ENCOUNTER — Encounter: Payer: Self-pay | Admitting: Family Medicine

## 2015-01-30 VITALS — BP 116/70 | Temp 98.4°F | Ht 68.0 in | Wt 180.0 lb

## 2015-01-30 DIAGNOSIS — H01006 Unspecified blepharitis left eye, unspecified eyelid: Secondary | ICD-10-CM

## 2015-01-30 MED ORDER — GENTAMICIN SULFATE 0.3 % OP SOLN: 2.0000 [drp] | Freq: Four times a day (QID) | OPHTHALMIC | Status: AC

## 2015-01-30 NOTE — Progress Notes (Signed)
   Subjective:    Patient ID: Tara Wong, female    DOB: [birth year], 57 y.o.   MRN: [id number]  Eye Pain  The left eye is affected. This is a new problem. The current episode started yesterday. The problem occurs constantly. The problem has been unchanged. There was no injury mechanism. The pain is moderate. Associated symptoms include an eye discharge. She has tried nothing for the symptoms. The treatment provided no relief.    Patient generally does not develop substantial allergies this time year.  Crustiness irritation left eye. I would somewhat tender. Slight discharge. Woke up with a crusty this point. No major change in vision. Next   Patient has no other concerns at this time.    Review of Systems  Eyes: Positive for pain and discharge.       Objective:   Physical Exam Alert vitals stable mild nasal congestion left inner eyelid inflamed somewhat tender slightly crusty good range of motion lungs clear. Heart regular in rhythm.       Assessment & Plan:  Impression blepharitis/conjunctivitis plan Garamycin 2 drops 4 times a day. Symptom care discussed local measures discussed 15 minutes spent most in answering questions WSL

## 2015-02-07 ENCOUNTER — Ambulatory Visit (INDEPENDENT_AMBULATORY_CARE_PROVIDER_SITE_OTHER): Payer: BLUE CROSS/BLUE SHIELD

## 2015-02-07 DIAGNOSIS — Z23 Encounter for immunization: Secondary | ICD-10-CM | POA: Diagnosis not present

## 2015-02-25 ENCOUNTER — Other Ambulatory Visit: Payer: Self-pay | Admitting: Family Medicine

## 2015-02-28 ENCOUNTER — Other Ambulatory Visit: Payer: Self-pay | Admitting: Nurse Practitioner

## 2015-04-08 ENCOUNTER — Other Ambulatory Visit: Payer: Self-pay | Admitting: Family Medicine

## 2015-05-21 ENCOUNTER — Other Ambulatory Visit: Payer: Self-pay | Admitting: Family Medicine

## 2015-05-23 ENCOUNTER — Ambulatory Visit (INDEPENDENT_AMBULATORY_CARE_PROVIDER_SITE_OTHER): Payer: BLUE CROSS/BLUE SHIELD | Admitting: Family Medicine

## 2015-05-23 ENCOUNTER — Encounter: Payer: Self-pay | Admitting: Family Medicine

## 2015-05-23 VITALS — BP 120/80 | Temp 98.3°F | Ht 68.0 in | Wt 181.0 lb

## 2015-05-23 DIAGNOSIS — M25552 Pain in left hip: Secondary | ICD-10-CM | POA: Diagnosis not present

## 2015-05-23 MED ORDER — NABUMETONE 750 MG PO TABS: 750.0000 mg | ORAL_TABLET | Freq: Two times a day (BID) | ORAL | Status: DC

## 2015-05-23 NOTE — Progress Notes (Signed)
   Subjective:    Patient ID: Tara Wong, female    DOB: 18-Apr-1956, 60 y.o.   MRN: [id number]  Hip Pain  The incident occurred more than 1 week ago. The incident occurred at home. There was no injury mechanism. The pain is present in the left hip. The quality of the pain is described as aching. The pain is at a severity of 5/10. The pain is moderate. She reports no foreign bodies present. The symptoms are aggravated by weight bearing and movement. She has tried NSAIDs for the symptoms. The treatment provided mild relief.   Patient states that she has no other concerns at this time.   hurts superior hip with walking and standing and aching, sharp at times  Worse when getting out of car  Using heating pad at night, layng on it, helps some   advil using two at a time, several times per day  No major hip pain in the   Leg gets slightly numb going dow n leg with pressure   Recalls no injury  Review of Systems No abdominal pain no rectal pain minimal radiation ROS otherwise negative    Objective:   Physical Exam   Alert vitals stable. Lungs clear heart rhythm H&T normal left anterior pelvis some pain and insertion of anterior musculature into superior pelvis hip good range of motion no trochanteric tenderness     Assessment & Plan:  Impression hip pain likely insertional tendinitis plan anti-inflammatory medicine prescribed walking encourage symptom care discussed no x-rays for now rationale discussed WSL

## 2015-08-23 ENCOUNTER — Encounter: Payer: Self-pay | Admitting: Nurse Practitioner

## 2015-08-23 ENCOUNTER — Ambulatory Visit (INDEPENDENT_AMBULATORY_CARE_PROVIDER_SITE_OTHER): Payer: BLUE CROSS/BLUE SHIELD | Admitting: Nurse Practitioner

## 2015-08-23 VITALS — BP 130/78 | Temp 98.2°F | Ht 68.0 in | Wt 181.0 lb

## 2015-08-23 DIAGNOSIS — R062 Wheezing: Secondary | ICD-10-CM

## 2015-08-23 DIAGNOSIS — J209 Acute bronchitis, unspecified: Secondary | ICD-10-CM | POA: Diagnosis not present

## 2015-08-23 DIAGNOSIS — K219 Gastro-esophageal reflux disease without esophagitis: Secondary | ICD-10-CM | POA: Diagnosis not present

## 2015-08-23 DIAGNOSIS — J0110 Acute frontal sinusitis, unspecified: Secondary | ICD-10-CM

## 2015-08-23 MED ORDER — PREDNISONE 20 MG PO TABS: ORAL_TABLET | ORAL | Status: DC

## 2015-08-23 MED ORDER — ALBUTEROL SULFATE HFA 108 (90 BASE) MCG/ACT IN AERS: 2.0000 | INHALATION_SPRAY | RESPIRATORY_TRACT | Status: DC | PRN

## 2015-08-23 MED ORDER — HYDROCODONE-HOMATROPINE 5-1.5 MG/5ML PO SYRP: 5.0000 mL | ORAL_SOLUTION | ORAL | Status: DC | PRN

## 2015-08-23 MED ORDER — OMEPRAZOLE 40 MG PO CPDR: 40.0000 mg | DELAYED_RELEASE_CAPSULE | Freq: Every day | ORAL | Status: DC

## 2015-08-23 MED ORDER — AZITHROMYCIN 250 MG PO TABS: ORAL_TABLET | ORAL | Status: DC

## 2015-08-23 NOTE — Progress Notes (Signed)
Subjective:  Presents for complaints of cough and congestion that began 2 days ago. No fever. Slight sore throat. Frontal area headache/facial pressure. Runny nose. Frequent nonproductive cough. Began having frequent wheezing last night, had an old albuterol inhaler that she used multiple times during the night which provided temporary relief. No ear pain. Patient has cut back significantly on her smoking. Has been using e vap and smoking regular cigarettes on a rare basis. Also complaints of discomfort at the lower sternal/upper epigastric area between the breast at times. Has a history of acid reflux. Currently on Nexium OTC but omeprazole works better, needs a refill.  Objective:   BP 130/78 mmHg  Temp(Src) 98.2 F (36.8 C) (Oral)  Ht 5\' 8"  (1.727 m)  Wt 181 lb (82.101 kg)  BMI 27.53 kg/m2 NAD. Alert, oriented. TMs clear effusion, left side partially obscured with cerumen. Pharynx mildly injected with PND noted. Neck supple with mild soft anterior adenopathy. Lungs faint inspiratory and expiratory wheezes noted throughout lung fields, faint expiratory rhonchi. No wheezing or tachypnea. Normal color. Good airflow. Heart regular rate rhythm. Abdomen soft nondistended with distinct upper epigastric area tenderness noted on exam.  Assessment:  Problem List Items Addressed This Visit      Digestive   GERD (gastroesophageal reflux disease)   Relevant Medications   omeprazole (PRILOSEC) 40 MG capsule    Other Visit Diagnoses    Acute frontal sinusitis, recurrence not specified    -  Primary    Relevant Medications    HYDROcodone-homatropine (HYCODAN) 5-1.5 MG/5ML syrup    predniSONE (DELTASONE) 20 MG tablet    azithromycin (ZITHROMAX Z-PAK) 250 MG tablet    Acute bronchitis, unspecified organism        Wheezing          Plan:  Meds ordered this encounter  Medications  . omeprazole (PRILOSEC) 40 MG capsule    Sig: Take 1 capsule (40 mg total) by mouth daily.    Dispense:  90 capsule   Refill:  1    Order Specific Question:  Supervising Provider    Answer:  Mikey Kirschner [[id number]]  . HYDROcodone-homatropine (HYCODAN) 5-1.5 MG/5ML syrup    Sig: Take 5 mLs by mouth every 4 (four) hours as needed.    Dispense:  120 mL    Refill:  0    Order Specific Question:  Supervising Provider    Answer:  Mikey Kirschner [[id number]]  . predniSONE (DELTASONE) 20 MG tablet    Sig: 2 po qd x 5 d    Dispense:  10 tablet    Refill:  0    Order Specific Question:  Supervising Provider    Answer:  Mikey Kirschner [[id number]]  . azithromycin (ZITHROMAX Z-PAK) 250 MG tablet    Sig: Take 2 tablets (500 mg) on  Day 1,  followed by 1 tablet (250 mg) once daily on Days 2 through 5.    Dispense:  6 each    Refill:  0    Order Specific Question:  Supervising Provider    Answer:  Mikey Kirschner [[id number]]  . albuterol (PROVENTIL HFA;VENTOLIN HFA) 108 (90 Base) MCG/ACT inhaler    Sig: Inhale 2 puffs into the lungs every 4 (four) hours as needed.    Dispense:  1 Inhaler    Refill:  0    Order Specific Question:  Supervising Provider    Answer:  Mikey Kirschner [[id number]]   Discussed importance of smoking cessation. Warning signs reviewed. Call  back in 48 hours if no improvement, sooner if worse. Call back in 2-3 weeks if reflux symptoms and/or abdominal pain have not resolved.

## 2015-09-20 ENCOUNTER — Other Ambulatory Visit: Payer: Self-pay

## 2015-09-20 MED ORDER — AMOXICILLIN-POT CLAVULANATE 875-125 MG PO TABS: ORAL_TABLET | ORAL | Status: DC

## 2015-11-22 ENCOUNTER — Ambulatory Visit (INDEPENDENT_AMBULATORY_CARE_PROVIDER_SITE_OTHER): Payer: BLUE CROSS/BLUE SHIELD | Admitting: Family Medicine

## 2015-11-22 ENCOUNTER — Encounter: Payer: Self-pay | Admitting: Family Medicine

## 2015-11-22 VITALS — BP 118/82 | Ht 68.0 in | Wt 180.0 lb

## 2015-11-22 DIAGNOSIS — K921 Melena: Secondary | ICD-10-CM | POA: Diagnosis not present

## 2015-11-22 NOTE — Progress Notes (Signed)
   Subjective:    Patient ID: Tara Wong, female    DOB: 1955-06-28, 60 y.o.   MRN: [id number]  HPI Patient arrives after noticing blood in her stools for about 2 weeks off and on. Today patient states was noticeably worse and even included clots  No pain nod discom  Colonoscopy, neg at [facility] . Last colonoscopy 7 years ago. Was negative at that time. Told that she had some hemorrhoids. Next  No recent constipation.  No abdominal pain. Next  Positive bright red blood per stools last couple weeks. Today clots along with reddening of the commode. Patient understandably concerned    Hx of hemmorhoids     Soft stools     Review of Systems    No headache, no major weight loss or weight gain, no chest pain no back pain abdominal pain no change in bowel habits complete ROS otherwise negative  Objective:   Physical Exam  Alert vitals stable, NAD. Blood pressure good on repeat. HEENT normal. Lungs clear. Heart regular rate and rhythm. Perirectal exam hemorrhoid evident no internal masses no fissures      Assessment & Plan:  Impression hematochezia. Discussed at length. High likelihood not serious with external hemorrhoid but need to be 100% sure rationale discussed plan referral back to [facility] for colonoscopy [facility]

## 2015-11-27 ENCOUNTER — Encounter: Payer: Self-pay | Admitting: Family Medicine

## 2015-11-28 ENCOUNTER — Encounter: Payer: Self-pay | Admitting: Gastroenterology

## 2015-12-25 ENCOUNTER — Ambulatory Visit (INDEPENDENT_AMBULATORY_CARE_PROVIDER_SITE_OTHER): Payer: BLUE CROSS/BLUE SHIELD | Admitting: Gastroenterology

## 2015-12-25 ENCOUNTER — Other Ambulatory Visit: Payer: Self-pay

## 2015-12-25 ENCOUNTER — Encounter: Payer: Self-pay | Admitting: Gastroenterology

## 2015-12-25 VITALS — BP 118/70 | HR 91 | Temp 98.3°F | Ht 67.0 in | Wt 178.0 lb

## 2015-12-25 DIAGNOSIS — K625 Hemorrhage of anus and rectum: Secondary | ICD-10-CM

## 2015-12-25 MED ORDER — PEG-KCL-NACL-NASULF-NA ASC-C 100 G PO SOLR: 1.0000 | ORAL | 0 refills | Status: DC

## 2015-12-25 MED ORDER — HYDROCORTISONE 2.5 % RE CREA: 1.0000 "application " | TOPICAL_CREAM | Freq: Two times a day (BID) | RECTAL | 1 refills | Status: AC

## 2015-12-25 NOTE — Patient Instructions (Signed)
We have scheduled you for a colonoscopy with Dr. Oneida Alar in the near future.  Please complete the blood work today.  I have sent in a cream to use per rectum twice per day for 7 days.

## 2015-12-25 NOTE — Assessment & Plan Note (Signed)
60 year old female with new-onset rectal bleeding, hemodynamically stable, with last colonoscopy in 2010 noting internal hemorrhoids and hyperplastic polyp. Likely benign anorectal source but unable to exclude other occult etiology. Will check CBC now for baseline. Provide Anusol suppositories as well. Patient appears stable. Discussed need for diagnostic colonoscopy in near future. As of note, no family history of colon cancer or polyps.   Proceed with colonoscopy with Dr. Oneida Alar in the near future. The risks, benefits, and alternatives have been discussed in detail with the patient. They state understanding and desire to proceed.

## 2015-12-25 NOTE — Progress Notes (Addendum)
REVIEWED-NO ADDITIONAL RECOMMENDATIONS.  Primary Care Physician:  Mickie Hillier, MD Primary Gastroenterologist:  Dr. Oneida Alar   Chief Complaint  Patient presents with  . Rectal Bleeding    HPI:   Tara Wong is a 60 y.o. female presenting today at the request of Dr. Wolfgang Phoenix secondary to new onset rectal bleeding. Last colonoscopy in 2010 by Dr. Oneida Alar with internal hemorrhoids and hyperplastic polyp. Rectal exam at PCP with external hemorrhoid.   New onset rectal bleeding late July on tissue and in toilet. Blood clots. Noted intermittent blood clots, medium-sized amount today. No abdominal pain. No rectal pain, itching. A few days without a BM but mainly soft stool. No diarrhea. Some days just rectal bleeding. No melena. Notes lack of appetite. 12 lbs lost since the first of this month. States she normally weighs around 191. Today's weight 178. Upon review of chart, appears she stays in the 181 range.   Past Medical History:  Diagnosis Date  . GERD (gastroesophageal reflux disease)     Past Surgical History:  Procedure Laterality Date  . ABDOMINAL HYSTERECTOMY  07/1997  . CERVICAL SPINE SURGERY    . CHOLECYSTECTOMY    . COLONOSCOPY  10/2008   Dr. Oneida Alar: moderate internal hemorrhoids, 3 mm hyperplastic rectal polyp    Current Outpatient Prescriptions  Medication Sig Dispense Refill  . aspirin 81 MG tablet Take 81 mg by mouth daily.    Marland Kitchen omeprazole (PRILOSEC) 40 MG capsule Take 1 capsule (40 mg total) by mouth daily. 90 capsule 1   No current facility-administered medications for this visit.     Allergies as of 12/25/2015 - Review Complete 12/25/2015  Allergen Reaction Noted  . Cefzil [cefprozil] Diarrhea 11/30/2013    Family History  Problem Relation Age of Onset  . Kidney cancer Mother     breast  . Heart disease Father 16    MI  . Cancer Maternal Aunt     breast  . Cancer Maternal Grandmother     breast  . Colon cancer Neg Hx   . Colon polyps Neg Hx      Social History   Social History  . Marital status: Married    Spouse name: N/A  . Number of children: N/A  . Years of education: N/A   Occupational History  . housewife    Social History Main Topics  . Smoking status: Current Some Day Smoker  . Smokeless tobacco: Former Systems developer     Comment: as of Aug 2017, 2 ppd but working on quitting   . Alcohol use No  . Drug use: No  . Sexual activity: Yes    Birth control/ protection: Surgical   Other Topics Concern  . Not on file   Social History Narrative  . No narrative on file    Review of Systems: Gen: see HPI  CV: Denies chest pain, heart palpitations, peripheral edema, syncope.  Resp: Denies shortness of breath at rest or with exertion. Denies wheezing or cough.  GI: see HPI  GU : Denies urinary burning, urinary frequency, urinary hesitancy MS: +joint pain  Derm: Denies rash, itching, dry skin Psych: Denies depression, anxiety, memory loss, and confusion Heme: see HPI   Physical Exam: BP 118/70   Pulse 91   Temp 98.3 F (36.8 C) (Oral)   Ht 5\' 7"  (1.702 m)   Wt 178 lb (80.7 kg)   BMI 27.88 kg/m  General:   Alert and oriented. Pleasant and cooperative. Well-nourished and well-developed.  Head:  Normocephalic  and atraumatic. Eyes:  Without icterus, sclera clear and conjunctiva pink.  Ears:  Normal auditory acuity. Nose:  No deformity, discharge,  or lesions. Mouth:  No deformity or lesions, oral mucosa pink.  Lungs:  Clear to auscultation bilaterally. No wheezes, rales, or rhonchi. No distress.  Heart:  S1, S2 present without murmurs appreciated.  Abdomen:  +BS, soft, non-tender and non-distended. No HSM noted. No guarding or rebound. No masses appreciated.  Rectal:  Deferred  Msk:  Symmetrical without gross deformities. Normal posture. Extremities:  Without  edema. Neurologic:  Alert and  oriented x4;  grossly normal neurologically. Psych:  Alert and cooperative. Normal mood and affect.

## 2015-12-26 LAB — CBC
HCT: 39.2 % (ref 35.0–45.0)
HEMOGLOBIN: 12.9 g/dL (ref 11.7–15.5)
MCH: 28.3 pg (ref 27.0–33.0)
MCHC: 32.9 g/dL (ref 32.0–36.0)
MCV: 86 fL (ref 80.0–100.0)
MPV: 11.3 fL (ref 7.5–12.5)
Platelets: 234 10*3/uL (ref 140–400)
RBC: 4.56 MIL/uL (ref 3.80–5.10)
RDW: 14.2 % (ref 11.0–15.0)
WBC: 8 10*3/uL (ref 3.8–10.8)

## 2015-12-26 NOTE — Progress Notes (Signed)
cc'ed to pcp °

## 2016-01-02 NOTE — Progress Notes (Signed)
CBC normal. Continue with colonoscopy as planned.

## 2016-01-03 NOTE — Progress Notes (Signed)
Pt is aware.  

## 2016-01-03 NOTE — Progress Notes (Signed)
LMOM to call.

## 2016-01-15 ENCOUNTER — Ambulatory Visit (HOSPITAL_COMMUNITY)
Admission: RE | Admit: 2016-01-15 | Discharge: 2016-01-15 | Disposition: A | Discharge status: Home / Self Care | Payer: BLUE CROSS/BLUE SHIELD | Source: Ambulatory Visit | Attending: Gastroenterology | Admitting: Gastroenterology

## 2016-01-15 ENCOUNTER — Encounter (HOSPITAL_COMMUNITY)
Admission: RE | Disposition: A | Discharge status: Home / Self Care | Payer: Self-pay | Source: Ambulatory Visit | Attending: Gastroenterology

## 2016-01-15 ENCOUNTER — Encounter (HOSPITAL_COMMUNITY): Payer: Self-pay | Admitting: *Deleted

## 2016-01-15 ENCOUNTER — Ambulatory Visit (HOSPITAL_COMMUNITY): Payer: BLUE CROSS/BLUE SHIELD

## 2016-01-15 ENCOUNTER — Telehealth: Payer: Self-pay | Admitting: Gastroenterology

## 2016-01-15 ENCOUNTER — Other Ambulatory Visit: Payer: Self-pay

## 2016-01-15 DIAGNOSIS — Z8719 Personal history of other diseases of the digestive system: Secondary | ICD-10-CM | POA: Diagnosis not present

## 2016-01-15 DIAGNOSIS — K219 Gastro-esophageal reflux disease without esophagitis: Secondary | ICD-10-CM | POA: Insufficient documentation

## 2016-01-15 DIAGNOSIS — D122 Benign neoplasm of ascending colon: Secondary | ICD-10-CM | POA: Diagnosis not present

## 2016-01-15 DIAGNOSIS — K625 Hemorrhage of anus and rectum: Secondary | ICD-10-CM | POA: Diagnosis present

## 2016-01-15 DIAGNOSIS — F17200 Nicotine dependence, unspecified, uncomplicated: Secondary | ICD-10-CM | POA: Insufficient documentation

## 2016-01-15 DIAGNOSIS — Q438 Other specified congenital malformations of intestine: Secondary | ICD-10-CM | POA: Insufficient documentation

## 2016-01-15 DIAGNOSIS — K621 Rectal polyp: Secondary | ICD-10-CM | POA: Insufficient documentation

## 2016-01-15 DIAGNOSIS — Z7982 Long term (current) use of aspirin: Secondary | ICD-10-CM | POA: Diagnosis not present

## 2016-01-15 DIAGNOSIS — K635 Polyp of colon: Secondary | ICD-10-CM

## 2016-01-15 DIAGNOSIS — K921 Melena: Secondary | ICD-10-CM | POA: Diagnosis not present

## 2016-01-15 DIAGNOSIS — K648 Other hemorrhoids: Secondary | ICD-10-CM | POA: Diagnosis not present

## 2016-01-15 DIAGNOSIS — K644 Residual hemorrhoidal skin tags: Secondary | ICD-10-CM | POA: Insufficient documentation

## 2016-01-15 DIAGNOSIS — R109 Unspecified abdominal pain: Secondary | ICD-10-CM

## 2016-01-15 DIAGNOSIS — K649 Unspecified hemorrhoids: Secondary | ICD-10-CM

## 2016-01-15 HISTORY — PX: COLONOSCOPY: SHX5424

## 2016-01-15 SURGERY — COLONOSCOPY
Anesthesia: Moderate Sedation

## 2016-01-15 MED ORDER — SPOT INK MARKER SYRINGE KIT
PACK | SUBMUCOSAL | Status: DC | PRN
  Administered 2016-01-15: 4 mL via SUBMUCOSAL

## 2016-01-15 MED ORDER — MEPERIDINE HCL 100 MG/ML IJ SOLN
INTRAMUSCULAR | Status: DC | PRN
  Administered 2016-01-15 (×3): 25 mg via INTRAVENOUS

## 2016-01-15 MED ORDER — EPINEPHRINE HCL 0.1 MG/ML IJ SOSY
PREFILLED_SYRINGE | INTRAMUSCULAR | Status: AC
  Filled 2016-01-15: qty 10

## 2016-01-15 MED ORDER — MIDAZOLAM HCL 5 MG/5ML IJ SOLN
INTRAMUSCULAR | Status: AC
  Filled 2016-01-15: qty 10

## 2016-01-15 MED ORDER — SODIUM CHLORIDE 0.9 % IJ SOLN
INTRAMUSCULAR | Status: DC | PRN
  Administered 2016-01-15: 13 mL via INTRAVENOUS

## 2016-01-15 MED ORDER — SODIUM CHLORIDE 0.9 % IJ SOLN
PREFILLED_SYRINGE | INTRAMUSCULAR | Status: DC | PRN
  Administered 2016-01-15: 9 mL

## 2016-01-15 MED ORDER — ONDANSETRON HCL 4 MG/2ML IJ SOLN
INTRAMUSCULAR | Status: AC
  Filled 2016-01-15: qty 2

## 2016-01-15 MED ORDER — ONDANSETRON HCL 4 MG/2ML IJ SOLN
INTRAMUSCULAR | Status: DC | PRN
  Administered 2016-01-15: 4 mg via INTRAVENOUS

## 2016-01-15 MED ORDER — MIDAZOLAM HCL 5 MG/5ML IJ SOLN
INTRAMUSCULAR | Status: DC | PRN
  Administered 2016-01-15: 1 mg via INTRAVENOUS
  Administered 2016-01-15 (×3): 2 mg via INTRAVENOUS
  Administered 2016-01-15: 1 mg via INTRAVENOUS

## 2016-01-15 MED ORDER — STERILE WATER FOR IRRIGATION IR SOLN
Status: DC | PRN
  Administered 2016-01-15: 2.5 mL

## 2016-01-15 MED ORDER — SODIUM CHLORIDE 0.9 % IV SOLN
INTRAVENOUS | Status: DC
  Administered 2016-01-15: 08:00:00 via INTRAVENOUS

## 2016-01-15 MED ORDER — MEPERIDINE HCL 100 MG/ML IJ SOLN
INTRAMUSCULAR | Status: AC
  Filled 2016-01-15: qty 2

## 2016-01-15 NOTE — Telephone Encounter (Signed)
Referral has been sent.

## 2016-01-15 NOTE — Op Note (Addendum)
[facility] Patient Name: Tara Wong Procedure Date: 01/15/2016 8:33 AM MRN: [id number] Date of Birth: January 28, 1956 Attending MD: Barney Drain , MD CSN: [id number] Age: 60 Admit Type: Outpatient Procedure:                Colonoscopy with ENDOSCOPIC MUCOSAL RESECTION/COLD                            FORCEPS POLYPECTOMY Indications:              Hematochezia-LAST TCS 2010(HYPERPLASTIC POLYPS).                            DAILY ASA. 2 PPD SMOKER. Providers:                Barney Drain, MD, Lurline Del, RN, Purcell Nails Broadus,                            Merchant navy officer Referring MD:             Rosemary Holms, MD Medicines:                Meperidine 75 mg IV, Midazolam 8 mg IV Complications:            Severe pain-CT SCAN ABD/PELVIS W/O IV OR ORAL                            CONTRAST ORDERED. NO PERFORATION, ASCVD. Estimated Blood Loss:     Estimated blood loss was minimal. Procedure:                Pre-Anesthesia Assessment:                           - Prior to the procedure, a History and Physical                            was performed, and patient medications and                            allergies were reviewed. The patient's tolerance of                            previous anesthesia was also reviewed. The risks                            and benefits of the procedure and the sedation                            options and risks were discussed with the patient.                            All questions were answered, and informed consent                            was obtained. Prior Anticoagulants: The patient has  taken aspirin, last dose was day of procedure. ASA                            Grade Assessment: I - A normal, healthy patient.                            After reviewing the risks and benefits, the patient                            was deemed in satisfactory condition to undergo the                            procedure. After obtaining informed  consent, the                            colonoscope was passed under direct vision.                            Throughout the procedure, the patient's blood                            pressure, pulse, and oxygen saturations were                            monitored continuously. The [id number]                            scope was introduced through the anus and advanced                            to the 5 cm into the ileum. The terminal ileum,                            ileocecal valve, appendiceal orifice, and rectum                            were photographed. The colonoscopy was technically                            difficult and complex due to a tortuous colon.                            Successful completion of the procedure was aided by                            COLOWRAP. The patient tolerated the procedure                            fairly well. The quality of the bowel preparation                            was excellent. Scope In: 8:56:06 AM Scope Out: 9:58:48 AM Scope Withdrawal Time: 1 hour 0 minutes 22  seconds  Total Procedure Duration: 1 hour 2 minutes 42 seconds  Findings:      The digital rectal exam findings include non-thrombosed external       hemorrhoids.      Five sessile polyps were found in the rectum and sigmoid colon. The       polyps were 2 to 4 mm in size. These polyps were removed with a cold       biopsy forceps. Resection and retrieval were complete.      A 40 mm polyp was found in the proximal ascending colon. The polyp was       sessile. Preparations were made for mucosal resection. 13 mL of saline       was injected with adequate lift of the lesion from the muscularis       propria. Snare mucosal resection with suction (via the working channel)       retrieval was performed. A 40 mm area was resected. Resection and       retrieval were complete. EDGES CAUTERIZED WITH APC(0.5 L/MIN 25W) There       was no bleeding at the end of the procedure.  To prevent bleeding       post-intervention, four hemostatic clips were successfully placed (MR       conditional). There was no bleeding at the end of the procedure. Area       was tattooed with an injection of 5 mL of Spot (carbon black).      The recto-sigmoid colon and sigmoid colon were moderately redundant. AND       COLOWRAP      Non-bleeding internal hemorrhoids were found. The hemorrhoids were large.      Non-bleeding external hemorrhoids were found. The hemorrhoids were       moderate. Impression:               - Non-thrombosed external hemorrhoids .                           - Five SMALL polyps in the rectum(3) and in the                            sigmoid colon(2), removed                           - One LARGE polyp in the proximal ascending colon,                            removed with mucosal resection.                           - Redundant LEFT colon.                           - RECTAL BLEEDING DUE TO Non-bleeding internal                            hemorrhoids. Moderate Sedation:      Moderate (conscious) sedation was administered by the endoscopy nurse       and supervised by the endoscopist. The following parameters were       monitored: oxygen saturation, heart rate, blood pressure, and response  to care. Total physician intraservice time was 76 minutes. Recommendation:           - Resume previous diet.                           - No aspirin, ibuprofen, naproxen, or other                            non-steroidal anti-inflammatory drugs for 7 days                            after polyp removal.                           - Await pathology results.                           - Repeat colonoscopy in 1 year for surveillance                            BECAUSE LARGE POLYP REMOVED PIECE MEAL. NO MRI FOR                            30 DAYS DUE TO METAL CLIPS IN THE COLON. ALLFIRST                            DEGREE RELATIVES NEED TCS AT AGE 24.                            -SEE SURGERY FOR HEMORRHOIDECTOMY.                           - SEE DR Wolfgang Phoenix FOR LIPID PROFILE AND RISK FACTOR                            MODIFICATION FOR ATHEROSCLEROTIC CARDIOVASCULAR                            DISEASE                           - Patient has a contact number available for                            emergencies. The signs and symptoms of potential                            delayed complications were discussed with the                            patient. Return to normal activities tomorrow.                            Written discharge instructions were provided to the  patient. Procedure Code(s):        --- Professional ---                           [phone], Colonoscopy, flexible; with endoscopic                            mucosal resection                           45380, 67, Colonoscopy, flexible; with biopsy,                            single or multiple                           99152, Moderate sedation services provided by the                            same physician or other qualified health care                            professional performing the diagnostic or                            therapeutic service that the sedation supports,                            requiring the presence of an independent trained                            observer to assist in the monitoring of the                            patient's level of consciousness and physiological                            status; initial 15 minutes of intraservice time,                            patient age 43 years or older                           [phone], Moderate sedation services; each additional                            15 minutes intraservice time                           99153, Moderate sedation services; each additional                            15 minutes intraservice time                           99153, Moderate sedation services; each additional  15 minutes intraservice time                           99153, Moderate sedation services; each additional                            15 minutes intraservice time Diagnosis Code(s):        --- Professional ---                           K62.1, Rectal polyp                           D12.5, Benign neoplasm of sigmoid colon                           D12.2, Benign neoplasm of ascending colon                           K64.4, Residual hemorrhoidal skin tags                           K64.8, Other hemorrhoids                           K92.1, Melena (includes Hematochezia)                           Q43.8, Other specified congenital malformations of                            intestine CPT copyright 2016 American Medical [employer]. All rights reserved. The codes documented in this report are preliminary and upon coder review may  be revised to meet current compliance requirements. Barney Drain, MD Barney Drain, MD 01/15/2016 10:22:53 AM This report has been signed electronically. Number of Addenda: 0

## 2016-01-15 NOTE — H&P (Signed)
  Primary Care Physician:  Mickie Hillier, MD Primary Gastroenterologist:  Dr. Oneida Alar  Pre-Procedure History & Physical: HPI:  Tara Wong is a 60 y.o. female here for  RECTAL BLEEDING.  Past Medical History:  Diagnosis Date  . GERD (gastroesophageal reflux disease)     Past Surgical History:  Procedure Laterality Date  . ABDOMINAL HYSTERECTOMY  07/1997  . CERVICAL SPINE SURGERY    . CHOLECYSTECTOMY    . COLONOSCOPY  10/2008   Dr. Oneida Alar: moderate internal hemorrhoids, 3 mm hyperplastic rectal polyp    Prior to Admission medications   Medication Sig Start Date End Date Taking? Authorizing Provider  aspirin 81 MG tablet Take 81 mg by mouth daily.   Yes Historical Provider, MD  hydrocortisone (ANUSOL-HC) 2.5 % rectal cream Place 1 application rectally 2 (two) times daily. 12/25/15  Yes Annitta Needs, NP  omeprazole (PRILOSEC) 40 MG capsule Take 1 capsule (40 mg total) by mouth daily. 08/23/15  Yes Nilda Simmer, NP  peg 3350 powder (MOVIPREP) 100 g SOLR Take 1 kit (200 g total) by mouth as directed. 12/25/15  Yes Danie Binder, MD    Allergies as of 12/25/2015 - Review Complete 12/25/2015  Allergen Reaction Noted  . Cefzil [cefprozil] Diarrhea 11/30/2013    Family History  Problem Relation Age of Onset  . Kidney cancer Mother     breast  . Heart disease Father 48    MI  . Cancer Maternal Aunt     breast  . Cancer Maternal Grandmother     breast  . Colon cancer Neg Hx   . Colon polyps Neg Hx     Social History   Social History  . Marital status: Married    Spouse name: N/A  . Number of children: N/A  . Years of education: N/A   Occupational History  . housewife    Social History Main Topics  . Smoking status: Current Some Day Smoker  . Smokeless tobacco: Former Systems developer     Comment: as of Aug 2017, 2 ppd but working on quitting   . Alcohol use No  . Drug use: No  . Sexual activity: Yes    Birth control/ protection: Surgical   Other Topics Concern  . Not  on file   Social History Narrative  . No narrative on file    Review of Systems: See HPI, otherwise negative ROS   Physical Exam: BP 140/79   Pulse 90   Temp 98 F (36.7 C) (Oral)   Resp (!) 23   Ht '5\' 7"'$  (1.702 m)   Wt 178 lb (80.7 kg)   SpO2 99%   BMI 27.88 kg/m  General:   Alert,  pleasant and cooperative in NAD Head:  Normocephalic and atraumatic. Neck:  Supple; Lungs:  Clear throughout to auscultation.    Heart:  Regular rate and rhythm. Abdomen:  Soft, nontender and nondistended. Normal bowel sounds, without guarding, and without rebound.   Neurologic:  Alert and  oriented x4;  grossly normal neurologically.  Impression/Plan:    BRBPR  PLAN: TCS TODAY

## 2016-01-15 NOTE — Telephone Encounter (Signed)
REFER RO DR. Arnoldo Morale [facility] FOR HEMORRHOIDECTOMY, DX: INTERNAL AND EXTERNAL HEMORRHOIDS, RECTAL BLEEDING.

## 2016-01-15 NOTE — Discharge Instructions (Signed)
You have LARGE linternal hemorrhoids, WHICH ARE THE CAUSE FOR YOUR RECTAL BLEEDING. YOU HAVE MODERATE EXTERNAL HEMORRHOIDS. YOU HAD ONE LARGE AND FIVE SMALL POLYPS REMOVED. THE LAST PART OF YOUR SMALL BOWEL IS NORMAL. I PLACED FOUR CLIPS TO PREVENT BLEEDING IN 7-10 DAYS.   NO ASPIRIN, BC/GOODY POWDERS, IBUPROFEN/MOTRIN, OR NAPROXEN/ALEVE FOR 7 DAYS BECAUSE YOU HAD A LARGE POLYP REMOVED.  YOU SHOULD STOP SMOKING. IT INCREASES YOUR RISK OF COLON CANCER.  NO MRI FOR 30 DAYS DUE TO METAL CLIP PLACEMENT IN THE COLON.  YOU DO NOT HAVE A HOLE IN YOUR COLON. Your CT SHOWS HARDENING OF THE ARTERIES IN THE ABDOMEN AND PELVIS. You should see Dr. Richardson Landry to check [place].    DRINK WATER TO KEEP YOUR URINE LIGHT YELLOW.  FOLLOW A HIGH FIBER DIET. AVOID ITEMS THAT CAUSE BLOATING. See info below.  USE PREPARATION H OR HYDROCORTISONE CREAM FOUR TIMES  A DAY FOR 7 DAYS TO RELIEVE RECTAL PAIN/PRESSURE/BLEEDING. RESTRICT USE OF HYDROCORTISONE CREAM TO 3-4 TIMES A YEAR. IF CREAMS   I WILL REFER YOU TO SURGERY TO FIX YOUR HEMORRHOIDS.  YOUR BIOPSY RESULTS WILL BE AVAILABLE IN MY CHART  SEP 20 AND MY OFFICE WILL CONTACT YOU IN 10-14 DAYS WITH YOUR RESULTS.   Next colonoscopy in 1 year. YOUR SISTERS, BROTHERS, CHILDREN, AND PARENTS NEED TO HAVE A COLONOSCOPY STARTING AT THE AGE OF 40.    Colonoscopy Care After Read the instructions outlined below and refer to this sheet in the next week. These discharge instructions provide you with general information on caring for yourself after you leave the hospital. While your treatment has been planned according to the most current medical practices available, unavoidable complications occasionally occur. If you have any problems or questions after discharge, call DR. Dexton Zwilling, [phone].  ACTIVITY  You may resume your regular activity, but move at a slower pace for the next 24 hours.   Take frequent rest periods for the  next 24 hours.   Walking will help get rid of the air and reduce the bloated feeling in your belly (abdomen).   No driving for 24 hours (because of the medicine (anesthesia) used during the test).   You may shower.   Do not sign any important legal documents or operate any machinery for 24 hours (because of the anesthesia used during the test).    NUTRITION  Drink plenty of fluids.   You may resume your normal diet as instructed by your doctor.   Begin with a light meal and progress to your normal diet. Heavy or fried foods are harder to digest and may make you feel sick to your stomach (nauseated).   Avoid alcoholic beverages for 24 hours or as instructed.    MEDICATIONS  You may resume your normal medications.   WHAT YOU CAN EXPECT TODAY  Some feelings of bloating in the abdomen.   Passage of more gas than usual.   Spotting of blood in your stool or on the toilet paper  .  IF YOU HAD POLYPS REMOVED DURING THE COLONOSCOPY:  Eat a soft diet IF YOU HAVE NAUSEA, BLOATING, ABDOMINAL PAIN, OR VOMITING.    FINDING OUT THE RESULTS OF YOUR TEST Not all test results are available during your visit. DR. Oneida Alar WILL CALL YOU WITHIN 14 DAYS OF YOUR PROCEDUE WITH YOUR RESULTS. Do not assume everything is normal if you have not heard from DR. Lamarr Feenstra, CALL HER OFFICE AT [phone].  SEEK IMMEDIATE  MEDICAL ATTENTION AND CALL THE OFFICE: [phone] IF:  You have more than a spotting of blood in your stool.   Your belly is swollen (abdominal distention).   You are nauseated or vomiting.   You have a temperature over 101F.   You have abdominal pain or discomfort that is severe or gets worse throughout the day.  High-Fiber Diet A high-fiber diet changes your normal diet to include more whole grains, legumes, fruits, and vegetables. Changes in the diet involve replacing refined carbohydrates with unrefined foods. The calorie level of the diet is essentially unchanged. The  Dietary Reference Intake (recommended amount) for adult males is 38 grams per day. For adult females, it is 25 grams per day. Pregnant and lactating women should consume 28 grams of fiber per day. Fiber is the intact part of a plant that is not broken down during digestion. Functional fiber is fiber that has been isolated from the plant to provide a beneficial effect in the body. PURPOSE  Increase stool bulk.   Ease and regulate bowel movements.   Lower cholesterol.  REDUCE RISK OF COLON CANCER  INDICATIONS THAT YOU NEED MORE FIBER  Constipation and hemorrhoids.   Uncomplicated diverticulosis (intestine condition) and irritable bowel syndrome.   Weight management.   As a protective measure against hardening of the arteries (atherosclerosis), diabetes, and cancer.   GUIDELINES FOR INCREASING FIBER IN THE DIET  Start adding fiber to the diet slowly. A gradual increase of about 5 more grams (2 slices of whole-wheat bread, 2 servings of most fruits or vegetables, or 1 bowl of high-fiber cereal) per day is best. Too rapid an increase in fiber may result in constipation, flatulence, and bloating.   Drink enough water and fluids to keep your urine clear or pale yellow. Water, juice, or caffeine-free drinks are recommended. Not drinking enough fluid may cause constipation.   Eat a variety of high-fiber foods rather than one type of fiber.   Try to increase your intake of fiber through using high-fiber foods rather than fiber pills or supplements that contain small amounts of fiber.   The goal is to change the types of food eaten. Do not supplement your present diet with high-fiber foods, but replace foods in your present diet.   INCLUDE A VARIETY OF FIBER SOURCES  Replace refined and processed grains with whole grains, canned fruits with fresh fruits, and incorporate other fiber sources. White rice, white breads, and most bakery goods contain little or no fiber.   Brown whole-grain rice,  buckwheat oats, and many fruits and vegetables are all good sources of fiber. These include: broccoli, Brussels sprouts, cabbage, cauliflower, beets, sweet potatoes, white potatoes (skin on), carrots, tomatoes, eggplant, squash, berries, fresh fruits, and dried fruits.   Cereals appear to be the richest source of fiber. Cereal fiber is found in whole grains and bran. Bran is the fiber-rich outer coat of cereal grain, which is largely removed in refining. In whole-grain cereals, the bran remains. In breakfast cereals, the largest amount of fiber is found in those with "bran" in their names. The fiber content is sometimes indicated on the label.   You may need to include additional fruits and vegetables each day.   In baking, for 1 cup white flour, you may use the following substitutions:   1 cup whole-wheat flour minus 2 tablespoons.   1/2 cup white flour plus 1/2 cup whole-wheat flour.   Polyps, Colon  A polyp is extra tissue that grows inside your body.  Colon polyps grow in the large intestine. The large intestine, also called the colon, is part of your digestive system. It is a long, hollow tube at the end of your digestive tract where your body makes and stores stool. Most polyps are not dangerous. They are benign. This means they are not cancerous. But over time, some types of polyps can turn into cancer. Polyps that are smaller than a pea are usually not harmful. But larger polyps could someday become or may already be cancerous. To be safe, doctors remove all polyps and test them.   PREVENTION There is not one sure way to prevent polyps. You might be able to lower your risk of getting them if you:  Eat more fruits and vegetables and less fatty food.   Do not smoke.   Avoid alcohol.   Exercise every day.   Lose weight if you are overweight.   Eating more calcium and folate can also lower your risk of getting polyps. Some foods that are rich in calcium are milk, cheese, and broccoli.  Some foods that are rich in folate are chickpeas, kidney beans, and spinach.    Hemorrhoids Hemorrhoids are dilated (enlarged) veins around the rectum. Sometimes clots will form in the veins. This makes them swollen and painful. These are called thrombosed hemorrhoids. Causes of hemorrhoids include:  Constipation.   Straining to have a bowel movement.   HEAVY LIFTING  HOME CARE INSTRUCTIONS  Eat a well balanced diet and drink 6 to 8 glasses of water every day to avoid constipation. You may also use a bulk laxative.   Avoid straining to have bowel movements.   Keep anal area dry and clean.   Do not use a donut shaped pillow or sit on the toilet for long periods. This increases blood pooling and pain.   Move your bowels when your body has the urge; this will require less straining and will decrease pain and pressure.

## 2016-01-16 ENCOUNTER — Telehealth: Payer: Self-pay | Admitting: Family Medicine

## 2016-01-16 ENCOUNTER — Telehealth: Payer: Self-pay

## 2016-01-16 DIAGNOSIS — E785 Hyperlipidemia, unspecified: Secondary | ICD-10-CM

## 2016-01-16 DIAGNOSIS — K219 Gastro-esophageal reflux disease without esophagitis: Secondary | ICD-10-CM

## 2016-01-16 NOTE — Telephone Encounter (Signed)
Pt is needing labs for an upcoming appt. She will need them printed out due to her insurance requesting her to get them [facility] at [facility]. There are no previous labs in epic done by [place].

## 2016-01-16 NOTE — Telephone Encounter (Signed)
I don't understand message

## 2016-01-16 NOTE — Telephone Encounter (Signed)
Pt is needing lab orders printed out. There are no recent labs in epic.

## 2016-01-16 NOTE — Telephone Encounter (Signed)
Lip liv m7 

## 2016-01-16 NOTE — Telephone Encounter (Signed)
Called pt and left message with her husband and informed of appt with Dr. Rosana Hoes (general surgeon) for 02/06/16 at 2:30 pm.

## 2016-01-17 ENCOUNTER — Telehealth: Payer: Self-pay | Admitting: Gastroenterology

## 2016-01-17 NOTE — Telephone Encounter (Signed)
Please call pt. She had ONE LARGE simple adenoma AND FIVE HYPERPLASTIC POLYPS removed.   NO ASPIRIN, BC/GOODY POWDERS, IBUPROFEN/MOTRIN, OR NAPROXEN/ALEVE FOR 7 DAYS BECAUSE YOU HAD A LARGE POLYP REMOVED.  STOP SMOKING. IT INCREASES YOUR RISK OF COLON CANCER.  NO MRI FOR 30 DAYS DUE TO METAL CLIP PLACEMENT IN THE COLON.  DRINK WATER TO KEEP YOUR URINE LIGHT YELLOW.  FOLLOW A HIGH FIBER DIET. AVOID ITEMS THAT CAUSE BLOATING.   USE PREPARATION H OR HYDROCORTISONE CREAM FOUR TIMES  A DAY FOR 7 DAYS TO RELIEVE RECTAL PAIN/PRESSURE/BLEEDING.   I WILL REFER YOU TO SURGERY TO FIX YOUR HEMORRHOIDS.  Next colonoscopy in 1 year. YOUR SISTERS, BROTHERS, CHILDREN, AND PARENTS NEED TO HAVE A COLONOSCOPY STARTING AT THE AGE OF 40.

## 2016-01-17 NOTE — Telephone Encounter (Signed)
Left message return call 01/17/16

## 2016-01-17 NOTE — Telephone Encounter (Signed)
Tried to call with no answer  

## 2016-01-17 NOTE — Telephone Encounter (Signed)
Reminder in epic °

## 2016-01-17 NOTE — Telephone Encounter (Signed)
Spoke with patient and informed her per Dr.Steve [place] have been ordered. Labs orders have been printed and are at work desk for patient to pick up. Patient verbalized understanding.

## 2016-01-18 NOTE — Telephone Encounter (Signed)
Letter mailed to pt to call.  

## 2016-01-18 NOTE — Telephone Encounter (Signed)
Pt called and was made aware of her results.

## 2016-01-22 ENCOUNTER — Encounter: Payer: Self-pay | Admitting: Family Medicine

## 2016-01-22 ENCOUNTER — Other Ambulatory Visit: Payer: Self-pay | Admitting: Family Medicine

## 2016-01-22 LAB — LIPID PANEL
Cholesterol: 183 mg/dL (ref 125–200)
HDL: 38 mg/dL — ABNORMAL LOW (ref >=46)
LDL CALC: 86 mg/dL (ref <130)
Total CHOL/HDL Ratio: 4.8 Ratio (ref <=5.0)
Triglycerides: 296 mg/dL — ABNORMAL HIGH (ref <150)
VLDL: 59 mg/dL — AB (ref <30)

## 2016-01-22 LAB — BASIC METABOLIC PANEL
BUN: 9 mg/dL (ref 7–25)
CHLORIDE: 110 mmol/L (ref 98–110)
CO2: 26 mmol/L (ref 20–31)
CREATININE: 0.74 mg/dL (ref 0.50–1.05)
Calcium: 9.3 mg/dL (ref 8.6–10.4)
GLUCOSE: 83 mg/dL (ref 65–99)
POTASSIUM: 4.4 mmol/L (ref 3.5–5.3)
Sodium: 144 mmol/L (ref 135–146)

## 2016-01-22 LAB — HEPATIC FUNCTION PANEL
ALK PHOS: 109 U/L (ref 33–130)
ALT: 10 U/L (ref 6–29)
AST: 15 U/L (ref 10–35)
Albumin: 3.8 g/dL (ref 3.6–5.1)
BILIRUBIN DIRECT: 0.1 mg/dL (ref <=0.2)
BILIRUBIN INDIRECT: 0.2 mg/dL (ref 0.2–1.2)
Total Bilirubin: 0.3 mg/dL (ref 0.2–1.2)
Total Protein: 6.3 g/dL (ref 6.1–8.1)

## 2016-01-23 ENCOUNTER — Encounter (HOSPITAL_COMMUNITY): Payer: Self-pay | Admitting: Gastroenterology

## 2016-01-29 ENCOUNTER — Ambulatory Visit (INDEPENDENT_AMBULATORY_CARE_PROVIDER_SITE_OTHER): Payer: BLUE CROSS/BLUE SHIELD | Admitting: Family Medicine

## 2016-01-29 ENCOUNTER — Encounter: Payer: Self-pay | Admitting: Family Medicine

## 2016-01-29 VITALS — BP 130/80 | Ht 67.0 in | Wt 179.1 lb

## 2016-01-29 DIAGNOSIS — Z23 Encounter for immunization: Secondary | ICD-10-CM | POA: Diagnosis not present

## 2016-01-29 DIAGNOSIS — K219 Gastro-esophageal reflux disease without esophagitis: Secondary | ICD-10-CM

## 2016-01-29 DIAGNOSIS — E785 Hyperlipidemia, unspecified: Secondary | ICD-10-CM | POA: Diagnosis not present

## 2016-01-29 MED ORDER — ATORVASTATIN CALCIUM 20 MG PO TABS: ORAL_TABLET | ORAL | 1 refills | Status: DC

## 2016-01-29 MED ORDER — BUPROPION HCL ER (SR) 150 MG PO TB12: ORAL_TABLET | ORAL | 5 refills | Status: DC

## 2016-01-29 NOTE — Progress Notes (Signed)
   Subjective:    Patient ID: Tara Wong, female    DOB: [birth year], 58 y.o.   MRN: [id number] Patient arrives office with multiple concerns Hyperlipidemia  This is a chronic problem. The current episode started more than 1 year ago. There are no compliance problems.   Next  Patient had a GI bleed. See Dr. Oneida Alar notes. Wonders if she really needs to see surgeon. Colonoscopy revealed fairly extensive external and internal hemorrhoids.  Ongoing challenges with smoking. Tried Chantix in the past but it caused side effects. Took Wellbutrin in the past and it did help.   Results for orders placed or performed in visit on [id number]  Basic metabolic panel  Result Value Ref Range   Sodium 144 135 - 146 mmol/L   Potassium 4.4 3.5 - 5.3 mmol/L   Chloride 110 98 - 110 mmol/L   CO2 26 20 - 31 mmol/L   Glucose, Bld 83 65 - 99 mg/dL   BUN 9 7 - 25 mg/dL   Creat 0.74 0.50 - 1.05 mg/dL   Calcium 9.3 8.6 - 10.4 mg/dL  Lipid panel  Result Value Ref Range   Cholesterol 183 125 - 200 mg/dL   Triglycerides 296 (H) <150 mg/dL   HDL 38 (L) >=46 mg/dL   Total CHOL/HDL Ratio 4.8 <=5.0 Ratio   VLDL 59 (H) <30 mg/dL   LDL Cholesterol 86 <130 mg/dL  Hepatic function panel  Result Value Ref Range   Total Bilirubin 0.3 0.2 - 1.2 mg/dL   Bilirubin, Direct 0.1 <=0.2 mg/dL   Indirect Bilirubin 0.2 0.2 - 1.2 mg/dL   Alkaline Phosphatase 109 33 - 130 U/L   AST 15 10 - 35 U/L   ALT 10 6 - 29 U/L   Total Protein 6.3 6.1 - 8.1 g/dL   Albumin 3.8 3.6 - 5.1 g/dL    Patient states no other concerns this visit.   Review of Systems No headache, no major weight loss or weight gain, no chest pain no back pain abdominal pain no change in bowel habits complete ROS otherwise negative     Objective:   Physical Exam  Alert vitals stable, NAD. Blood pressure good on repeat. HEENT normal. Lungs clear. Heart regular rate and rhythm.   Scan reviewed. Substantial atherosclerosis evident. Advanced for patient's  age.      Assessment & Plan:  Impression 1 advanced atherosclerosis discussed at length #2 hyperlipidemia blood work reviewed particularly in light of #1 #3 chronic smoker discussed with patient wanting to try medication plan flu shot today. Initiate Wellbutrin SR rationale discussed side effects and benefits discussed. Initiate Lipitor generic 20 mg daily. Check liver and lipid profile in 3 months to assess status. Diet exercise discussed. WSL

## 2016-02-06 NOTE — Patient Instructions (Signed)
Tara Wong  02/06/2016     @PREFPERIOPPHARMACY @   Your procedure is scheduled on 02/09/2016.  Report to [facility] at 6:15 A.M.  Call this number if you have problems the morning of surgery:  [phone]   Remember:  Do not eat food or drink liquids after midnight.  Take these medicines the morning of surgery with A SIP OF WATER : Wellbutrin, Claritin and Prilosec   Do not wear jewelry, make-up or nail polish.  Do not wear lotions, powders, or perfumes, or deoderant.  Do not shave 48 hours prior to surgery.  Men may shave face and neck.  Do not bring valuables to the hospital.  [facility] is not responsible for any belongings or valuables.  Contacts, dentures or bridgework may not be worn into surgery.  Leave your suitcase in the car.  After surgery it may be brought to your room.  For patients admitted to the hospital, discharge time will be determined by your treatment team.  Patients discharged the day of surgery will not be allowed to drive home.   Name and phone number of your driver:   family Special instructions:  n/a  Please read over the following fact sheets that you were given. Care and Recovery After Surgery   Surgical Procedures for Hemorrhoids Surgical procedures can be used to treat hemorrhoids. Hemorrhoids are swollen veins that are inside the rectum (internal hemorrhoids) or around the anus (external hemorrhoids). They are caused by increased pressure in the anal area. This pressure may result from straining to have a bowel movement (constipation), diarrhea, pregnancy, obesity, anal sex, or sitting for long periods of time. Hemorrhoids can cause symptoms such as pain and bleeding. Surgery may be needed if diet changes, lifestyle changes, and other treatments do not help your symptoms. Various surgical methods may be used. Three common methods are:  Closed hemorrhoidectomy. The hemorrhoids are surgically removed, and the surgical cuts (incisions) are  closed with stitches (sutures).  Open hemorrhoidectomy. The hemorrhoids are surgically removed, but the incisions are allowed to heal without sutures.  Stapled hemorrhoidopexy. The hemorrhoids are removed using a device that takes out a ring of excess tissue. LET [facility] CARE PROVIDER KNOW ABOUT:  Any allergies you have.  All medicines you are taking, including vitamins, herbs, eye drops, creams, and over-the-counter medicines.  Previous problems you or members of your family have had with the use of anesthetics.  Any blood disorders you have.  Previous surgeries you have had.  Any medical conditions you have.  Whether you are pregnant or may be pregnant. RISKS AND COMPLICATIONS Generally, this is a safe procedure. However, problems may occur, including:  Infection.  Bleeding.  Allergic reactions to medicines.  Damage to other structures or organs.  Pain.  Constipation.  Difficulty passing urine.  Narrowing of the anal canal (stenosis).  Difficulty controlling bowel movements (incontinence). BEFORE THE PROCEDURE  Ask your health care provider about:  Changing or stopping your regular medicines. This is especially important if you are taking diabetes medicines or blood thinners.  Taking medicines such as aspirin and ibuprofen. These medicines can thin your blood. Do not take these medicines before your procedure if your health care provider instructs you not to.  You may need to have a procedure to examine the inside of your colon with a scope (colonoscopy). Your health care provider may do this to make sure that there are no other causes for your bleeding or pain.  Follow instructions from  your health care provider about eating or drinking restrictions.  You may be instructed to take a laxative and an enema to clean out your colon before surgery (bowel prep). Carefully follow instructions from your health care provider about bowel prep.  Ask your health care  provider how your surgical site will be marked or identified.  You may be given antibiotic medicine to help prevent infection.  Plan to have someone take you home after the procedure. PROCEDURE  To reduce your risk of infection:  Your health care team will wash or sanitize their hands.  Your skin will be washed with soap.  An IV tube will be inserted into one of your veins.  You will be given one or more of the following:  A medicine to help you relax (sedative).  A medicine to numb the area (local anesthetic).  A medicine to make you fall asleep (general anesthetic).  A medicine that is injected into an area of your body to numb everything below the injection site (regional anesthetic).  A lubricating jelly may be placed into your rectum.  Your surgeon will insert a short scope (anoscope) into your rectum to examine the hemorrhoids.  One of the following hemorrhoid procedures will be performed. Closed Hemorrhoidectomy  Your surgeon will use surgical instruments to open the tissue around the hemorrhoids.  The veins that supply the hemorrhoids will be tied off with a suture.  The hemorrhoids will be removed.  The tissue that surrounds the hemorrhoids will be closed with sutures that your body can absorb (absorbable sutures). Open Hemorrhoidectomy  The hemorrhoids will be removed with surgical instruments.  The incisions will be left open to heal without sutures. Stapled Hemorrhoidopexy  Your surgeon will use a circular stapling device to remove the hemorrhoids.  The device will be inserted into your anus. It will remove a circular ring of tissue that includes hemorrhoid tissue and some tissue above the hemorrhoids.  The staples in the device will close the edges of removed tissue. This will cut off the blood supply to the hemorrhoids and will pull any remaining hemorrhoids back into place. Each of these procedures may vary among health care providers and  hospitals. AFTER THE PROCEDURE  Your blood pressure, heart rate, breathing rate, and blood oxygen level will be monitored often until the medicines you were given have worn off.  You will be given pain medicine as needed.   This information is not intended to replace advice given to you by your health care provider. Make sure you discuss any questions you have with your health care provider.   Document Released: 02/10/2009 Document Revised: 01/04/2015 Document Reviewed: 07/11/2014 Elsevier Interactive Patient Education [employer].

## 2016-02-07 ENCOUNTER — Encounter (HOSPITAL_COMMUNITY): Payer: Self-pay

## 2016-02-07 ENCOUNTER — Encounter (HOSPITAL_COMMUNITY)
Admission: RE | Admit: 2016-02-07 | Discharge: 2016-02-07 | Disposition: A | Discharge status: Home / Self Care | Payer: BLUE CROSS/BLUE SHIELD | Source: Ambulatory Visit | Attending: Surgery | Admitting: Surgery

## 2016-02-07 DIAGNOSIS — Z90710 Acquired absence of both cervix and uterus: Secondary | ICD-10-CM | POA: Diagnosis not present

## 2016-02-07 DIAGNOSIS — K644 Residual hemorrhoidal skin tags: Secondary | ICD-10-CM | POA: Diagnosis not present

## 2016-02-07 DIAGNOSIS — K219 Gastro-esophageal reflux disease without esophagitis: Secondary | ICD-10-CM | POA: Diagnosis not present

## 2016-02-07 DIAGNOSIS — Z7982 Long term (current) use of aspirin: Secondary | ICD-10-CM | POA: Diagnosis not present

## 2016-02-07 DIAGNOSIS — K641 Second degree hemorrhoids: Secondary | ICD-10-CM | POA: Diagnosis not present

## 2016-02-07 DIAGNOSIS — Z86010 Personal history of colonic polyps: Secondary | ICD-10-CM | POA: Diagnosis not present

## 2016-02-07 DIAGNOSIS — Z8541 Personal history of malignant neoplasm of cervix uteri: Secondary | ICD-10-CM | POA: Diagnosis not present

## 2016-02-07 DIAGNOSIS — F17210 Nicotine dependence, cigarettes, uncomplicated: Secondary | ICD-10-CM | POA: Diagnosis not present

## 2016-02-07 HISTORY — DX: Pure hypercholesterolemia, unspecified: E78.00

## 2016-02-07 LAB — CBC WITH DIFFERENTIAL/PLATELET
Basophils Absolute: 0 10*3/uL (ref 0.0–0.1)
Basophils Relative: 0 %
Eosinophils Absolute: 0 10*3/uL (ref 0.0–0.7)
Eosinophils Relative: 1 %
HEMATOCRIT: 40.5 % (ref 36.0–46.0)
HEMOGLOBIN: 13.4 g/dL (ref 12.0–15.0)
LYMPHS ABS: 2.4 10*3/uL (ref 0.7–4.0)
LYMPHS PCT: 30 %
MCH: 29 pg (ref 26.0–34.0)
MCHC: 33.1 g/dL (ref 30.0–36.0)
MCV: 87.7 fL (ref 78.0–100.0)
Monocytes Absolute: 0.6 10*3/uL (ref 0.1–1.0)
Monocytes Relative: 7 %
Neutro Abs: 5.1 10*3/uL (ref 1.7–7.7)
Neutrophils Relative %: 62 %
Platelets: 204 10*3/uL (ref 150–400)
RBC: 4.62 MIL/uL (ref 3.87–5.11)
RDW: 14.5 % (ref 11.5–15.5)
WBC: 8.1 10*3/uL (ref 4.0–10.5)

## 2016-02-07 LAB — BASIC METABOLIC PANEL
Anion gap: 6 (ref 5–15)
BUN: 12 mg/dL (ref 6–20)
CHLORIDE: 109 mmol/L (ref 101–111)
CO2: 25 mmol/L (ref 22–32)
Calcium: 9.1 mg/dL (ref 8.9–10.3)
Creatinine, Ser: 0.88 mg/dL (ref 0.44–1.00)
GFR calc Af Amer: >60 mL/min (ref >60)
GFR calc non Af Amer: >60 mL/min (ref >60)
GLUCOSE: 86 mg/dL (ref 65–99)
POTASSIUM: 4 mmol/L (ref 3.5–5.1)
SODIUM: 140 mmol/L (ref 135–145)

## 2016-02-09 ENCOUNTER — Ambulatory Visit (HOSPITAL_COMMUNITY): Payer: BLUE CROSS/BLUE SHIELD | Admitting: Anesthesiology

## 2016-02-09 ENCOUNTER — Encounter (HOSPITAL_COMMUNITY): Payer: Self-pay | Admitting: *Deleted

## 2016-02-09 ENCOUNTER — Encounter (HOSPITAL_COMMUNITY)
Admission: RE | Disposition: A | Discharge status: Home / Self Care | Payer: Self-pay | Source: Ambulatory Visit | Attending: Surgery

## 2016-02-09 ENCOUNTER — Ambulatory Visit (HOSPITAL_COMMUNITY)
Admission: RE | Admit: 2016-02-09 | Discharge: 2016-02-09 | Disposition: A | Discharge status: Home / Self Care | Payer: BLUE CROSS/BLUE SHIELD | Source: Ambulatory Visit | Attending: Surgery | Admitting: Surgery

## 2016-02-09 DIAGNOSIS — K644 Residual hemorrhoidal skin tags: Secondary | ICD-10-CM | POA: Insufficient documentation

## 2016-02-09 DIAGNOSIS — K641 Second degree hemorrhoids: Secondary | ICD-10-CM | POA: Diagnosis not present

## 2016-02-09 DIAGNOSIS — Z7982 Long term (current) use of aspirin: Secondary | ICD-10-CM | POA: Insufficient documentation

## 2016-02-09 DIAGNOSIS — Z86010 Personal history of colonic polyps: Secondary | ICD-10-CM | POA: Insufficient documentation

## 2016-02-09 DIAGNOSIS — F17210 Nicotine dependence, cigarettes, uncomplicated: Secondary | ICD-10-CM | POA: Insufficient documentation

## 2016-02-09 DIAGNOSIS — Z8541 Personal history of malignant neoplasm of cervix uteri: Secondary | ICD-10-CM | POA: Insufficient documentation

## 2016-02-09 DIAGNOSIS — K219 Gastro-esophageal reflux disease without esophagitis: Secondary | ICD-10-CM | POA: Insufficient documentation

## 2016-02-09 DIAGNOSIS — Z90710 Acquired absence of both cervix and uterus: Secondary | ICD-10-CM | POA: Insufficient documentation

## 2016-02-09 HISTORY — PX: HEMORRHOID SURGERY: SHX153

## 2016-02-09 SURGERY — HEMORRHOIDECTOMY
Anesthesia: General | Site: Anus

## 2016-02-09 MED ORDER — CHLORHEXIDINE GLUCONATE CLOTH 2 % EX PADS: 6.0000 | MEDICATED_PAD | Freq: Once | CUTANEOUS | Status: DC

## 2016-02-09 MED ORDER — HYDROMORPHONE HCL 1 MG/ML IJ SOLN
0.2500 mg | INTRAMUSCULAR | Status: DC | PRN
  Administered 2016-02-09 (×2): 0.25 mg via INTRAVENOUS

## 2016-02-09 MED ORDER — LIDOCAINE HCL (PF) 1 % IJ SOLN
INTRAMUSCULAR | Status: AC
  Filled 2016-02-09: qty 30

## 2016-02-09 MED ORDER — MIDAZOLAM HCL 5 MG/5ML IJ SOLN
INTRAMUSCULAR | Status: DC | PRN
  Administered 2016-02-09: 2 mg via INTRAVENOUS

## 2016-02-09 MED ORDER — LACTATED RINGERS IV SOLN
INTRAVENOUS | Status: DC
  Administered 2016-02-09: 07:00:00 via INTRAVENOUS
  Administered 2016-02-09: 500 mL via INTRAVENOUS

## 2016-02-09 MED ORDER — SUCCINYLCHOLINE CHLORIDE 20 MG/ML IJ SOLN
INTRAMUSCULAR | Status: DC | PRN
  Administered 2016-02-09: 140 mg via INTRAVENOUS

## 2016-02-09 MED ORDER — SODIUM CHLORIDE 0.9 % IJ SOLN
INTRAMUSCULAR | Status: AC
  Filled 2016-02-09: qty 10

## 2016-02-09 MED ORDER — DEXTROSE 5 % IV SOLN
INTRAVENOUS | Status: DC | PRN
  Administered 2016-02-09: 08:00:00 via INTRAVENOUS

## 2016-02-09 MED ORDER — LIDOCAINE VISCOUS 2 % MT SOLN
OROMUCOSAL | Status: DC | PRN
  Administered 2016-02-09: 1

## 2016-02-09 MED ORDER — ONDANSETRON HCL 4 MG/2ML IJ SOLN
4.0000 mg | Freq: Once | INTRAMUSCULAR | Status: AC
  Administered 2016-02-09: 4 mg via INTRAVENOUS

## 2016-02-09 MED ORDER — FLEET ENEMA 7-19 GM/118ML RE ENEM
1.0000 | ENEMA | Freq: Once | RECTAL | Status: DC
  Filled 2016-02-09: qty 1

## 2016-02-09 MED ORDER — ROCURONIUM BROMIDE 100 MG/10ML IV SOLN
INTRAVENOUS | Status: DC | PRN
  Administered 2016-02-09: 5 mg via INTRAVENOUS

## 2016-02-09 MED ORDER — HYDROMORPHONE HCL 1 MG/ML IJ SOLN
INTRAMUSCULAR | Status: AC
  Filled 2016-02-09: qty 1

## 2016-02-09 MED ORDER — DOCUSATE SODIUM 100 MG PO TABS: 100.0000 mg | ORAL_TABLET | Freq: Two times a day (BID) | ORAL | 0 refills | Status: DC | PRN

## 2016-02-09 MED ORDER — METRONIDAZOLE IN NACL 5-0.79 MG/ML-% IV SOLN
500.0000 mg | INTRAVENOUS | Status: AC
Start: 2016-02-09 — End: 2016-02-09
  Administered 2016-02-09: 500 mg via INTRAVENOUS
  Filled 2016-02-09: qty 100

## 2016-02-09 MED ORDER — FENTANYL CITRATE (PF) 100 MCG/2ML IJ SOLN
INTRAMUSCULAR | Status: DC | PRN
  Administered 2016-02-09 (×2): 50 ug via INTRAVENOUS
  Administered 2016-02-09: 100 ug via INTRAVENOUS

## 2016-02-09 MED ORDER — MIDAZOLAM HCL 2 MG/2ML IJ SOLN
INTRAMUSCULAR | Status: AC
  Filled 2016-02-09: qty 2

## 2016-02-09 MED ORDER — PROPOFOL 10 MG/ML IV BOLUS
INTRAVENOUS | Status: DC | PRN
  Administered 2016-02-09: 140 mg via INTRAVENOUS

## 2016-02-09 MED ORDER — LIDOCAINE VISCOUS 2 % MT SOLN
OROMUCOSAL | Status: AC
  Filled 2016-02-09: qty 15

## 2016-02-09 MED ORDER — BUPIVACAINE HCL (PF) 0.5 % IJ SOLN
INTRAMUSCULAR | Status: AC
  Filled 2016-02-09: qty 30

## 2016-02-09 MED ORDER — SURGILUBE EX GEL
CUTANEOUS | Status: DC | PRN
  Administered 2016-02-09: 1 via TOPICAL

## 2016-02-09 MED ORDER — MIDAZOLAM HCL 2 MG/2ML IJ SOLN
1.0000 mg | INTRAMUSCULAR | Status: DC | PRN
  Administered 2016-02-09 (×2): 2 mg via INTRAVENOUS
  Filled 2016-02-09: qty 2

## 2016-02-09 MED ORDER — DEXTROSE 5 % IV SOLN
INTRAVENOUS | Status: DC | PRN
  Administered 2016-02-09: 07:00:00 via INTRAVENOUS

## 2016-02-09 MED ORDER — LIDOCAINE HCL 1 % IJ SOLN
INTRAMUSCULAR | Status: DC | PRN
  Administered 2016-02-09: 30 mg via INTRADERMAL

## 2016-02-09 MED ORDER — LIDOCAINE HCL 1 % IJ SOLN
INTRAMUSCULAR | Status: DC | PRN
  Administered 2016-02-09: 10 mL via INTRAMUSCULAR

## 2016-02-09 MED ORDER — PROPOFOL 10 MG/ML IV BOLUS
INTRAVENOUS | Status: AC
  Filled 2016-02-09: qty 20

## 2016-02-09 MED ORDER — 0.9 % SODIUM CHLORIDE (POUR BTL) OPTIME
TOPICAL | Status: DC | PRN
  Administered 2016-02-09: 1000 mL

## 2016-02-09 MED ORDER — LIDOCAINE HCL (PF) 1 % IJ SOLN
INTRAMUSCULAR | Status: AC
  Filled 2016-02-09: qty 5

## 2016-02-09 MED ORDER — FENTANYL CITRATE (PF) 100 MCG/2ML IJ SOLN
INTRAMUSCULAR | Status: AC
  Filled 2016-02-09: qty 4

## 2016-02-09 MED ORDER — EPHEDRINE SULFATE 50 MG/ML IJ SOLN
INTRAMUSCULAR | Status: AC
  Filled 2016-02-09: qty 1

## 2016-02-09 MED ORDER — EPHEDRINE SULFATE 50 MG/ML IJ SOLN
INTRAMUSCULAR | Status: DC | PRN
  Administered 2016-02-09: 10 mg via INTRAVENOUS

## 2016-02-09 MED ORDER — SUCCINYLCHOLINE CHLORIDE 20 MG/ML IJ SOLN
INTRAMUSCULAR | Status: AC
  Filled 2016-02-09: qty 1

## 2016-02-09 MED ORDER — ONDANSETRON HCL 4 MG/2ML IJ SOLN
INTRAMUSCULAR | Status: AC
  Filled 2016-02-09: qty 2

## 2016-02-09 MED ORDER — CIPROFLOXACIN IN D5W 400 MG/200ML IV SOLN
400.0000 mg | INTRAVENOUS | Status: AC
  Administered 2016-02-09: 400 mg via INTRAVENOUS
  Filled 2016-02-09: qty 200

## 2016-02-09 MED ORDER — OXYCODONE-ACETAMINOPHEN 5-325 MG PO TABS: 1.0000 | ORAL_TABLET | ORAL | 0 refills | Status: DC | PRN

## 2016-02-09 MED ORDER — ROCURONIUM BROMIDE 50 MG/5ML IV SOLN
INTRAVENOUS | Status: AC
  Filled 2016-02-09: qty 1

## 2016-02-09 SURGICAL SUPPLY — 26 items
BAG HAMPER (MISCELLANEOUS) ×3 IMPLANT
CLOTH BEACON ORANGE TIMEOUT ST (SAFETY) ×3 IMPLANT
COVER LIGHT HANDLE STERIS (MISCELLANEOUS) ×6 IMPLANT
DECANTER SPIKE VIAL GLASS SM (MISCELLANEOUS) ×6 IMPLANT
DRAPE PROXIMA HALF (DRAPES) ×3 IMPLANT
ELECT REM PT RETURN 9FT ADLT (ELECTROSURGICAL) ×3
ELECTRODE REM PT RTRN 9FT ADLT (ELECTROSURGICAL) ×1 IMPLANT
FORMALIN 10 PREFIL 120ML (MISCELLANEOUS) ×3 IMPLANT
GAUZE SPONGE 4X4 12PLY STRL (GAUZE/BANDAGES/DRESSINGS) ×3 IMPLANT
GLOVE BIOGEL PI IND STRL 7.5 (GLOVE) ×1 IMPLANT
GLOVE BIOGEL PI INDICATOR 7.5 (GLOVE) ×2
GLOVE ECLIPSE 7.0 STRL STRAW (GLOVE) ×3 IMPLANT
GOWN STRL REUS W/TWL LRG LVL3 (GOWN DISPOSABLE) ×6 IMPLANT
HARMONIC SHEARS 14CM COAG (MISCELLANEOUS) ×2 IMPLANT
HEMOSTAT SURGICEL 4X8 (HEMOSTASIS) ×3 IMPLANT
KIT ROOM TURNOVER APOR (KITS) ×3 IMPLANT
MANIFOLD NEPTUNE II (INSTRUMENTS) ×3 IMPLANT
NDL HYPO 25X1 1.5 SAFETY (NEEDLE) ×1 IMPLANT
NEEDLE HYPO 25X1 1.5 SAFETY (NEEDLE) ×3 IMPLANT
NS IRRIG 1000ML POUR BTL (IV SOLUTION) ×3 IMPLANT
PACK PERI GYN (CUSTOM PROCEDURE TRAY) ×3 IMPLANT
PAD ARMBOARD 7.5X6 YLW CONV (MISCELLANEOUS) ×3 IMPLANT
SET BASIN LINEN APH (SET/KITS/TRAYS/PACK) ×3 IMPLANT
SUT CHROMIC 3 0 SH 27 (SUTURE) ×2 IMPLANT
SUT SILK 0 FSL (SUTURE) ×3 IMPLANT
SYR CONTROL 10ML LL (SYRINGE) ×3 IMPLANT

## 2016-02-09 NOTE — Op Note (Signed)
SURGICAL OPERATIVE REPORT   DATE OF PROCEDURE: 02/09/2016  ATTENDING Surgeon(s): Vickie Epley, MD  ANESTHESIA: GETA  PRE-OPERATIVE DIAGNOSIS: Bleeding 2nd degree internal hemorrhoids (icd-10's: K64.1)  POST-OPERATIVE DIAGNOSIS: Bleeding 2nd degree internal hemorrhoids (icd-10's: K64.1)  PROCEDURE(S):  1.) Anoscopy 2.) Extensive hemorrhectomy (Right anterior internal, Right posterior internal, and Left posterior internal hemorrhoids) (cpt: 46260)  INTRAOPERATIVE FINDINGS: 2nd degree internal hemorrhoids  INTRAVENOUS FLUIDS: 850 mL crystalloid   ESTIMATED BLOOD LOSS: Minimal (<20 mL)  URINE OUTPUT: No foley   SPECIMENS: Right anterior internal, Right posterior internal, and Left posterior internal hemorrhoids  IMPLANTS: None  DRAINS: None   COMPLICATIONS: None apparent   CONDITION AT END OF PROCEDURE: Hemodynamically stable and extubated   DISPOSITION OF PATIENT: PACU  INDICATIONS FOR PROCEDURE:  Patient is a 60 y.o. female who presented with bleeding 2nd degree internal. Patient underwent recent colonoscopy, which revealed multiple polyps and multiple large internal hemorrhoids, the latter of which was felt to be the source of patient's rectal bleeding. All risks, benefits, and alternatives to above procedure were discussed with the patient, all of patient's questions were answered to her expressed satisfaction, and informed consent was obtained and documented.  DETAILS OF PROCEDURE: Patient was brought to the operative suite and appropriately identified. General anesthesia was administered along with appropriate pre-operative antibiotics, and endotracheal intubation was performed by anesthetist. In high lithotomy position, operative site was prepped and draped in the usual sterile fashion, and following a brief time out, rigid anoscopy was performed with findings of multiple friable 2nd degree internal hemorrhoids. Local anesthetic was injected, and three hemorrhoidal  pedicles were identified. An Alice clamp and subsequent Kelly clamp were placed near the base of each pedicle near the dentate line and retracted externally to exteriorize the hemorrhoidal pedicle. Retracted hemorrhoid was carefully dissected from underlying/adherent tissues, and Harmonic scalpel was advanced across the base of the hemorrhoidal pedicle and fired, and the above was repeated for the Right and then Left posterior internal hemorrhoids. Hemostasis was confirmed, additional local anesthetic was injected, and lidocaine gel-soaked Surgicel wrapped around 4x4" gauze was inserted into the anal canal for additional hemostasis and pain control.  Patient was then safely able to be extubated, awakened, and transferred to PACU for post-operative monitoring and care.  I was present for all aspects of the above procedure, and there were no complications apparent.

## 2016-02-09 NOTE — Anesthesia Preprocedure Evaluation (Signed)
Anesthesia Evaluation  Patient identified by MRN, date of birth, ID band Patient awake    Reviewed: Allergy & Precautions, NPO status , Patient's Chart, lab work & pertinent test results  Airway Mallampati: I  TM Distance: >3 FB     Dental  (+) Edentulous Upper, Edentulous Lower   Pulmonary Current Smoker,    breath sounds clear to auscultation       Cardiovascular negative cardio ROS   Rhythm:Regular Rate:Normal     Neuro/Psych    GI/Hepatic GERD  Poorly Controlled,  Endo/Other    Renal/GU      Musculoskeletal   Abdominal   Peds  Hematology   Anesthesia Other Findings   Reproductive/Obstetrics                             Anesthesia Physical Anesthesia Plan  ASA: II  Anesthesia Plan: General   Post-op Pain Management:    Induction: Intravenous, Rapid sequence and Cricoid pressure planned  Airway Management Planned: Oral ETT  Additional Equipment:   Intra-op Plan:   Post-operative Plan: Extubation in OR  Informed Consent: I have reviewed the patients History and Physical, chart, labs and discussed the procedure including the risks, benefits and alternatives for the proposed anesthesia with the patient or authorized representative who has indicated his/her understanding and acceptance.     Plan Discussed with:   Anesthesia Plan Comments:         Anesthesia Quick Evaluation

## 2016-02-09 NOTE — Anesthesia Postprocedure Evaluation (Signed)
Anesthesia Post Note  Patient: Tara Wong  Procedure(s) Performed: Procedure(s) (LRB): EXTENSIVE HEMORRHOIDECTOMY (N/A)  Patient location during evaluation: PACU Anesthesia Type: General Level of consciousness: awake, oriented and patient cooperative Pain management: pain level controlled Vital Signs Assessment: post-procedure vital signs reviewed and stable Respiratory status: spontaneous breathing, nonlabored ventilation and respiratory function stable Cardiovascular status: blood pressure returned to baseline and stable Postop Assessment: no signs of nausea or vomiting Anesthetic complications: no    Last Vitals:  Vitals:   02/09/16 0730 02/09/16 0906  BP: 124/78   Pulse:  (!) (P) 123  Resp: (!) 24   Temp:  (P) 36.8 C    Last Pain:  Vitals:   02/09/16 0621  TempSrc: Oral                 Madie Cahn J

## 2016-02-09 NOTE — Interval H&P Note (Signed)
History and Physical Interval Note:  02/09/2016 7:25 AM  Tara Wong  has presented today for surgery, with the diagnosis of bleeding hemorrhoids  The various methods of treatment have been discussed with the patient and family. After consideration of risks, benefits and other options for treatment, the patient has consented to  Procedure(s): EXTENSIVE HEMORRHOIDECTOMY (N/A) as a surgical intervention .  The patient's history has been reviewed, patient examined, no change in status, stable for surgery.  I have reviewed the patient's chart and labs.  Questions were answered to the patient's satisfaction.     Vickie Epley

## 2016-02-09 NOTE — Transfer of Care (Signed)
Immediate Anesthesia Transfer of Care Note  Patient: Tara Wong  Procedure(s) Performed: Procedure(s): EXTENSIVE HEMORRHOIDECTOMY (N/A)  Patient Location: PACU  Anesthesia Type:General  Level of Consciousness: awake and patient cooperative  Airway & Oxygen Therapy: Patient Spontanous Breathing and Patient connected to face mask oxygen  Post-op Assessment: Report given to RN, Post -op Vital signs reviewed and stable and Patient moving all extremities  Post vital signs: Reviewed and stable  Last Vitals:  Vitals:   02/09/16 0725 02/09/16 0730  BP: 118/77 124/78  Resp: 19 (!) 24  Temp:      Last Pain:  Vitals:   02/09/16 0621  TempSrc: Oral      Patients Stated Pain Goal: 9 ([id number])  Complications: No apparent anesthesia complications

## 2016-02-09 NOTE — Anesthesia Procedure Notes (Signed)
Procedure Name: Intubation Date/Time: 02/09/2016 7:46 AM Performed by: Charmaine Downs Pre-anesthesia Checklist: Patient identified, Patient being monitored, Timeout performed, Emergency Drugs available and Suction available Patient Re-evaluated:Patient Re-evaluated prior to inductionOxygen Delivery Method: Circle System Utilized Preoxygenation: Pre-oxygenation with 100% oxygen Intubation Type: IV induction, Rapid sequence and Cricoid Pressure applied Ventilation: Mask ventilation without difficulty Laryngoscope Size: Mac and 3 Grade View: Grade I Tube type: Oral Tube size: 7.0 mm Number of attempts: 1 Airway Equipment and Method: stylet Placement Confirmation: ETT inserted through vocal cords under direct vision,  positive ETCO2 and breath sounds checked- equal and bilateral Secured at: 22 cm Tube secured with: Tape Dental Injury: Teeth and Oropharynx as per pre-operative assessment

## 2016-02-09 NOTE — H&P (Signed)
[facility] Surgical History and Physical  Subjective: 60 year old female presents forrectal bleeding x 1 month, for which she was initially referred to GI (Dr. Oneida Alar), who performed colonoscopy (01/15/2016). Colonoscopy revealed 3 small rectal polyps, 1 large proximal ascending colon sessile polyp (removed in pieces), 2 small sigmoid colon polyps, and external + internal hemorrhoids, and rectal bleeding was attributed to patient's moderately large internal hemorrhoids. Patient is referred accordingly for evaluation and management of patient's bleeding hemorrhoids. Patient reports the bleeding has continued every day with most, but not all, BM's, though she denies lightheadedness/dizziness, CP, or SOB. She reports she also recently started taking Wellbutrin to assist her with smoking cessation, but currently still smokes 2 ppd, as does her husband (also present for visit).   Review of Symptoms:  Constitutional:No fevers, chills, or unexplained weight loss Head:Atraumatic; no masses; no abnormalities Eyes:No visual changes or eye pain Cardiovascular: No chest pain or palpitations.  Respiratory:No cough, shortness of breath or wheezing  GastrointestinBlood per rectum as per HPI; no diarrhea, constipation, abdominal pain, or vomiting; heartburn controlled with medication Musculoskeletal:No arthalgias, myalgias or joint swelling Skin:No rash or bothersome skin lesions  Past Medical History:Reviewed  Past Medical History  Surgical History:  abdominal hysterectomy, cholecystectomy, cervical spine surgery (unspecified) Medical Problems:  GERD, cervical cancer Allergies:   cefprozil (diarrhea) Medications:   aspirin 81 mg, omeprazole  Social History:Reviewed  Social History  Preferred Language: English Race:  White Ethnicity: Not Hispanic / Latino Age: 56 year Marital Status:  M Alcohol:  denies  Smoking Status: Heavy tobacco smoker reviewed on 02/06/2016 Started Date: 02/06/1972 Packs  per day: 2.00  Functional Status ------------------------------------------------ Bathing: Normal Cooking: Normal Dressing: Normal Driving: Normal Eating: Normal Managing Meds: Normal Oral Care: Normal Shopping: Normal Toileting: Normal Transferring: Normal Walking: Normal  Cognitive Status ------------------------------------------------ Attention: Normal Decision Making: Normal Language: Normal Memory: Normal Motor: Normal Perception: Normal Problem Solving: Normal Visual and Spatial: Normal  Family History:Reviewed  Family Health History Mother, Deceased; Kidney or renal cancer; Breast cancer;  Father, Deceased; Heart attack (myocardial infarction); Coronary artery disease (ischemia);  Aunt, Unknown; Breast cancer;  Maternal Grandmother, Deceased; Breast cancer;   Vital Signs as of [id number]:  Systolic [id number]: Diastolic 69: Heart Rate 91: Temp 98.22F (Temporal)  Height 17ft 8in: Weight 179Lbs 0 Ounces   BMI 27.22 kg/m2  Physical Exam: General:Well appearing, well nourished in no distress. Skin:no rash or prominent lesions Head:Atraumatic; no masses; no abnormalities Eyes:conjunctiva clear, EOM intact, PERRL Heart:RRR, no murmur Lungs:CTA bilaterally, no wheezes, rhonchi, rales.  Breathing unlabored. Abdomen:Soft, NT/ND, no HSM, no masses. Anorectal: Anal sphincter tone WNL, no gross blood, +external hemorrhoid, internal hemorrhoids palpated but not visualized Extremities:No deformities, clubbing, cyanosis, or edema.   Assessment: 61 year old Female with external hemorrhoids and bleeding internal hemorrhoids, complicated by pertinent co-morbidities including heavy smoking tobacco abuse and GERD.  Plan:      - all risks, benefits, and alternatives to, as well as anticipated recovery for, hemorrhoidectomy discussed with patient and her husband, who together elect to proceed, all of their questions were answered to their expressed satisfaction, and  informed consent was obtained accordingly      - will plan for outpatient extensive hemorrhoidectomy Friday, 10/10 pending pre-anesthesia testing      - importance of smoking cessation discussed with patient and her husband      - surgical follow-up 2 weeks after above planned procedure      - repeat colonoscopy in 1 year per GI -- Corene Cornea  Honor Loh, MD, [place]: [facility] Office #: [phone]

## 2016-02-09 NOTE — Discharge Instructions (Addendum)
In addition to included general post-operative instructions for Post-Hemorrhoidectomy,  Diet: Resume home heart healthy diet.   Activity: No heavy lifting (children, pets, laundry) or strenuous activity until follow-up, but light activity and walking are encouraged. Do not drive or drink alcohol if taking narcotic pain medications.   Wound care: May shower/get incisions wet and shallow hot water baths +/- Epsom salts as needed for comfort  Medications: Resume all home medications. For mild to moderate pain: acetaminophen (Tylenol) or ibuprofen (if no kidney disease). Narcotic pain medications, if prescribed, can be used for severe pain, though may cause nausea, constipation, and drowsiness. Do not combine Tylenol and Percocet within a 6 hour period as Percocet contains Tylenol. If you do not need the narcotic pain medication, you do not need to fill the prescription. Colace stool softener also provided as needed for narcotics-associated constipation.  Call office [phone]) at any time if any questions, worsening pain, fevers/chills, bleeding, drainage from incision site, or other concerns.    Surgical Procedures for Hemorrhoids, Care After Refer to this sheet in the next few weeks. These instructions provide you with information about caring for yourself after your procedure. Your health care provider may also give you more specific instructions. Your treatment has been planned according to current medical practices, but problems sometimes occur. Call your health care provider if you have any problems or questions after your procedure. WHAT TO EXPECT AFTER THE PROCEDURE After your procedure, it is common to have:  Rectal pain.  Pain when you are having a bowel movement.  Slight rectal bleeding. HOME CARE INSTRUCTIONS Medicines  Take over-the-counter and prescription medicines only as told by your health care provider.  Do not drive or operate heavy machinery while taking  prescription pain medicine.  Use a stool softener or a bulk laxative as told by your health care provider. Activities  Rest at home. Return to your normal activities as told by your health care provider.  Do not lift anything that is heavier than 10 lb (4.5 kg).  Do not sit for long periods of time. Take a walk every day or as told by your health care provider.  Do not strain to have a bowel movement. Do not spend a long time sitting on the toilet. Eating and Drinking  Eat foods that contain fiber, such as whole grains, beans, nuts, fruits, and vegetables.  Drink enough fluid to keep your urine clear or pale yellow. General Instructions  Sit in a warm bath 2-3 times per day to relieve soreness or itching.  Keep all follow-up visits as told by your health care provider. This is important. SEEK MEDICAL CARE IF:  Your pain medicine is not helping.  You have a fever or chills.  You become constipated.  You have trouble passing urine. SEEK IMMEDIATE MEDICAL CARE IF:  You have very bad rectal pain.  You have heavy bleeding from your rectum.   This information is not intended to replace advice given to you by your health care provider. Make sure you discuss any questions you have with your health care provider.   Document Released: 07/06/2003 Document Revised: 01/04/2015 Document Reviewed: 07/11/2014 Elsevier Interactive Patient Education 2016 [employer]   PATIENT INSTRUCTIONS POST-ANESTHESIA  IMMEDIATELY FOLLOWING SURGERY:  Do not drive or operate machinery for the first twenty four hours after surgery.  Do not make any important decisions for twenty four hours after surgery or while taking narcotic pain medications or sedatives.  If you develop intractable nausea and vomiting  or a severe headache please notify your doctor immediately.  FOLLOW-UP:  Please make an appointment with your surgeon as instructed. You do not need to follow up with anesthesia unless specifically  instructed to do so.  WOUND CARE INSTRUCTIONS (if applicable):  Keep a dry clean dressing on the anesthesia/puncture wound site if there is drainage.  Once the wound has quit draining you may leave it open to air.  Generally you should leave the bandage intact for twenty four hours unless there is drainage.  If the epidural site drains for more than 36-48 hours please call the anesthesia department.  QUESTIONS?:  Please feel free to call your physician or the hospital operator if you have any questions, and they will be happy to assist you.

## 2016-02-15 ENCOUNTER — Encounter (HOSPITAL_COMMUNITY): Payer: Self-pay | Admitting: Surgery

## 2016-02-17 ENCOUNTER — Other Ambulatory Visit: Payer: Self-pay | Admitting: Nurse Practitioner

## 2016-02-27 ENCOUNTER — Other Ambulatory Visit: Payer: Self-pay | Admitting: *Deleted

## 2016-02-27 MED ORDER — BUPROPION HCL ER (SR) 150 MG PO TB12: ORAL_TABLET | ORAL | 1 refills | Status: DC

## 2016-03-18 ENCOUNTER — Other Ambulatory Visit: Payer: Self-pay | Admitting: *Deleted

## 2016-03-18 MED ORDER — BUPROPION HCL ER (SR) 150 MG PO TB12: ORAL_TABLET | ORAL | 1 refills | Status: DC

## 2016-05-23 ENCOUNTER — Encounter: Payer: Self-pay | Admitting: Family Medicine

## 2016-05-23 ENCOUNTER — Ambulatory Visit (INDEPENDENT_AMBULATORY_CARE_PROVIDER_SITE_OTHER): Payer: BLUE CROSS/BLUE SHIELD | Admitting: Family Medicine

## 2016-05-23 VITALS — BP 136/90 | Temp 98.7°F | Ht 68.0 in | Wt 182.0 lb

## 2016-05-23 DIAGNOSIS — J0110 Acute frontal sinusitis, unspecified: Secondary | ICD-10-CM | POA: Diagnosis not present

## 2016-05-23 MED ORDER — LEVOFLOXACIN 500 MG PO TABS: 500.0000 mg | ORAL_TABLET | Freq: Every day | ORAL | 0 refills | Status: AC

## 2016-05-23 MED ORDER — HYDROCODONE-HOMATROPINE 5-1.5 MG/5ML PO SYRP: ORAL_SOLUTION | ORAL | 0 refills | Status: DC

## 2016-05-23 NOTE — Progress Notes (Signed)
   Subjective:    Patient ID: Tara Wong, female    DOB: May 07, 1955, 61 y.o.   MRN: [id number]  Sinusitis  This is a new problem. Episode onset: 3 days. Associated symptoms include congestion, coughing, ear pain, headaches and a sore throat. (Fever, diarrhea) Treatments tried: advil, dayquil, nyquil.   Headache frontal   Cough productive   Viral symptoms in the family  Is a is symptoms going on for days. Frontal headache. Positive sharp. Positive aching. Cough productive yellowish phlegm  No wheezin   Flu shto this fall   Review of Systems  HENT: Positive for congestion, ear pain and sore throat.   Respiratory: Positive for cough.   Neurological: Positive for headaches.       Objective:   Physical Exam   Alert vitals stable, NAD. Blood pressure good on repeat. HEENT normal. Lungs clear. Heart regular rate and rhythm. Intermittent bronchial cough during exam. Positive malaise.     Assessment & Plan:  Impression post influenza rhinosinusitis/bronchitis plan antibiotics prescribed. Symptom care discussed warning signs discussed encourage to stop smoking. Patient requests Levaquin

## 2016-07-21 ENCOUNTER — Other Ambulatory Visit: Payer: Self-pay | Admitting: Family Medicine

## 2016-07-26 ENCOUNTER — Ambulatory Visit (INDEPENDENT_AMBULATORY_CARE_PROVIDER_SITE_OTHER): Payer: BLUE CROSS/BLUE SHIELD | Admitting: Family Medicine

## 2016-07-26 ENCOUNTER — Encounter: Payer: Self-pay | Admitting: Family Medicine

## 2016-07-26 VITALS — BP 130/80 | Ht 68.0 in | Wt 181.1 lb

## 2016-07-26 DIAGNOSIS — F321 Major depressive disorder, single episode, moderate: Secondary | ICD-10-CM | POA: Diagnosis not present

## 2016-07-26 DIAGNOSIS — E785 Hyperlipidemia, unspecified: Secondary | ICD-10-CM

## 2016-07-26 MED ORDER — ATORVASTATIN CALCIUM 20 MG PO TABS: 20.0000 mg | ORAL_TABLET | Freq: Every day | ORAL | 1 refills | Status: DC

## 2016-07-26 MED ORDER — OMEPRAZOLE 40 MG PO CPDR: 40.0000 mg | DELAYED_RELEASE_CAPSULE | Freq: Every day | ORAL | 1 refills | Status: DC

## 2016-07-26 MED ORDER — BUPROPION HCL ER (SR) 150 MG PO TB12: ORAL_TABLET | ORAL | 1 refills | Status: DC

## 2016-07-26 NOTE — Progress Notes (Signed)
   Subjective:    Patient ID: CECILIA Wong, female    DOB: April 05, 1956, 61 y.o.   MRN: [id number]  Hyperlipidemia  This is a chronic problem. The current episode started more than 1 year ago. There are no compliance problems.    States no other concerns this visit.  Labs ordered in October are still active. Patient states forgot to have labs drawn.   Patient continues to take lipid medication regularly. No obvious side effects from it. Generally does not miss a dose. Prior blood work results are reviewed with patient. Patient continues to work on fat intake in diet  Patient notes ongoing compliance with antidepressant medication. No obvious side effects. Reports does not miss a dose. Overall continues to help depression substantially. No thoughts of homicide or suicide. Would like to maintain medication.   Results for orders placed or performed during the hospital encounter of 02/07/16  CBC WITH DIFFERENTIAL  Result Value Ref Range   WBC 8.1 4.0 - 10.5 K/uL   RBC 4.62 3.87 - 5.11 MIL/uL   Hemoglobin 13.4 12.0 - 15.0 g/dL   HCT 40.5 36.0 - 46.0 %   MCV 87.7 78.0 - 100.0 fL   MCH 29.0 26.0 - 34.0 pg   MCHC 33.1 30.0 - 36.0 g/dL   RDW 14.5 11.5 - 15.5 %   Platelets 204 150 - 400 K/uL   Neutrophils Relative % 62 %   Neutro Abs 5.1 1.7 - 7.7 K/uL   Lymphocytes Relative 30 %   Lymphs Abs 2.4 0.7 - 4.0 K/uL   Monocytes Relative 7 %   Monocytes Absolute 0.6 0.1 - 1.0 K/uL   Eosinophils Relative 1 %   Eosinophils Absolute 0.0 0.0 - 0.7 K/uL   Basophils Relative 0 %   Basophils Absolute 0.0 0.0 - 0.1 K/uL  Basic metabolic panel  Result Value Ref Range   Sodium 140 135 - 145 mmol/L   Potassium 4.0 3.5 - 5.1 mmol/L   Chloride 109 101 - 111 mmol/L   CO2 25 22 - 32 mmol/L   Glucose, Bld 86 65 - 99 mg/dL   BUN 12 6 - 20 mg/dL   Creatinine, Ser 0.88 0.44 - 1.00 mg/dL   Calcium 9.1 8.9 - 10.3 mg/dL   GFR calc non Af Amer >60 >60 mL/min   GFR calc Af Amer >60 >60 mL/min   Anion gap  6 5 - 15   Stomach overall better' No sig exrcise  Smoking about a pack and a half per day,    Review of Systems Alert vitals stable, NAD. Blood pressure good on repeat. HEENT normal. Lungs clear. Heart regular rate and rhythm.     Objective:   Physical Exam Alert vitals stable, NAD. Blood pressure good on repeat. HEENT normal. Lungs clear. Heart regular rate and rhythm.        Assessment & Plan:  Impression 1 depression clinically stable. States that benefiting from medicine will maintain compliance discussed #2 hyperlipidemia status uncertain discuss, need to check blood work same maintain for now recheck in 6 months.

## 2016-07-27 DIAGNOSIS — F321 Major depressive disorder, single episode, moderate: Secondary | ICD-10-CM | POA: Insufficient documentation

## 2016-07-29 ENCOUNTER — Ambulatory Visit: Payer: BLUE CROSS/BLUE SHIELD | Admitting: Family Medicine

## 2016-09-17 ENCOUNTER — Ambulatory Visit (INDEPENDENT_AMBULATORY_CARE_PROVIDER_SITE_OTHER): Payer: BLUE CROSS/BLUE SHIELD | Admitting: Family Medicine

## 2016-09-17 ENCOUNTER — Encounter: Payer: Self-pay | Admitting: Family Medicine

## 2016-09-17 VITALS — BP 160/84 | Temp 98.4°F | Ht 68.0 in | Wt 177.4 lb

## 2016-09-17 DIAGNOSIS — G44209 Tension-type headache, unspecified, not intractable: Secondary | ICD-10-CM

## 2016-09-17 DIAGNOSIS — R030 Elevated blood-pressure reading, without diagnosis of hypertension: Secondary | ICD-10-CM

## 2016-09-17 MED ORDER — HYDROCODONE-ACETAMINOPHEN 5-325 MG PO TABS: 1.0000 | ORAL_TABLET | Freq: Four times a day (QID) | ORAL | 0 refills | Status: DC | PRN

## 2016-09-17 NOTE — Progress Notes (Signed)
   Subjective:    Patient ID: Tara Wong, female    DOB: 06-12-1955, 61 y.o.   MRN: [id number]  Headache   This is a new problem. The current episode started in the past 7 days. Associated symptoms comments: Stress . She has tried acetaminophen for the symptoms. The treatment provided no relief.   Diffuse bad headache  incrsleeplessness  No sig chest pain  Dim enrgy     Generally has history of normal blood pressure         Patient states feels she may be stressed. Recently lost her husband last week.   Review of Systems  Neurological: Positive for headaches.       Objective:   Physical Exam Alert vitals stable blood pressure still elevated on repeat 150/90 slight malaise. Lungs clear. Heart regular in rhythm.       Assessment & Plan:  Impression progressive headaches with elevated blood pressure under quite a bit of stress with recent death of husband in fact just last week. Patient states all over-the-counter pain medicine not helping at all Plan stress reduction discussed. Grief discussed. Hydrocodone when necessary for severe headache. This will not be a long-term prescription discussed.

## 2016-12-04 ENCOUNTER — Encounter: Payer: Self-pay | Admitting: Gastroenterology

## 2017-05-09 ENCOUNTER — Other Ambulatory Visit: Payer: Self-pay

## 2017-05-09 MED ORDER — OSELTAMIVIR PHOSPHATE 75 MG PO CAPS: ORAL_CAPSULE | ORAL | 0 refills | Status: DC

## 2017-06-11 ENCOUNTER — Other Ambulatory Visit: Payer: Self-pay | Admitting: Family Medicine

## 2017-09-09 ENCOUNTER — Other Ambulatory Visit: Payer: Self-pay | Admitting: Family Medicine

## 2017-09-30 ENCOUNTER — Ambulatory Visit (INDEPENDENT_AMBULATORY_CARE_PROVIDER_SITE_OTHER): Payer: No Typology Code available for payment source | Admitting: Family Medicine

## 2017-09-30 ENCOUNTER — Encounter: Payer: Self-pay | Admitting: Family Medicine

## 2017-09-30 VITALS — BP 138/90 | Ht 68.0 in | Wt 173.0 lb

## 2017-09-30 DIAGNOSIS — R5383 Other fatigue: Secondary | ICD-10-CM

## 2017-09-30 DIAGNOSIS — F321 Major depressive disorder, single episode, moderate: Secondary | ICD-10-CM | POA: Diagnosis not present

## 2017-09-30 DIAGNOSIS — E785 Hyperlipidemia, unspecified: Secondary | ICD-10-CM

## 2017-09-30 DIAGNOSIS — R030 Elevated blood-pressure reading, without diagnosis of hypertension: Secondary | ICD-10-CM | POA: Diagnosis not present

## 2017-09-30 DIAGNOSIS — K219 Gastro-esophageal reflux disease without esophagitis: Secondary | ICD-10-CM | POA: Diagnosis not present

## 2017-09-30 MED ORDER — BUPROPION HCL ER (SR) 150 MG PO TB12: ORAL_TABLET | ORAL | 3 refills | Status: DC

## 2017-09-30 NOTE — Progress Notes (Signed)
   Subjective:    Patient ID: Tara Wong, female    DOB: 10-31-1955, 62 y.o.   MRN: [id number] Patient arrives with numerous concerns HPI Patient is here today due to her chronic health issues.She states she has taken herself off of most of her medications. She no longer is taking Wellbutrin and is not taking her reflux medication.  With dietary changes patient reports rare reflux at this time.  Would like to stay off medication.  Rarely has a clear  Wellbutrin definitely helped in the past.  Patient's husband passed away 1 year ago.  Ongoing struggles with this.  No suicidal or homicidal thoughts.  But substantial issues with depression.  States definitely helps her would like to get back on  Patient has history of hyperlipidemia.  At one point LDL was quite high.  Has not had blood work in several years status uncertain.  Admits to a lot of fast food diet.  Unfortunately patient continues smoke 1/2 to 1 pack a day.  Review of Systems No headache, no major weight loss or weight gain, no chest pain no back pain abdominal pain no change in bowel habits complete ROS otherwise negative     Objective:   Physical Exam  Alert and oriented, vitals reviewed and stable, NAD ENT-TM's and ext canals WNL bilat via otoscopic exam Soft palate, tonsils and post pharynx WNL via oropharyngeal exam Neck-symmetric, no masses; thyroid nonpalpable and nontender Pulmonary-no tachypnea or accessory muscle use; Clear without wheezes via auscultation Card--no abnrml murmurs, rhythm reg and rate WNL Carotid pulses symmetric, without bruits       Assessment & Plan:  Impression depression.  With element of anxiety.  Long-standing.  Benefit from Wellbutrin in the past  2.  Chronic smoking encouraged to stop  3 hyperlipidemia.  Status uncertain.  Check blood work.  4.  Reflux.  Overall improved.  Will hold off on medication.  Appropriate blood work/further recommendations based on  results  Greater than 50% of this 25 minute face to face visit was spent in counseling and discussion and coordination of care regarding the above diagnosis/diagnosies

## 2017-10-03 LAB — BASIC METABOLIC PANEL
BUN / CREAT RATIO: 15 (ref 9–23)
BUN: 12 mg/dL (ref 6–24)
CO2: 20 mmol/L (ref 20–29)
CREATININE: 0.81 mg/dL (ref 0.57–1.00)
Calcium: 9.7 mg/dL (ref 8.7–10.2)
Chloride: 111 mmol/L — ABNORMAL HIGH (ref 96–106)
GFR calc Af Amer: 92 mL/min/{1.73_m2} (ref >59)
GFR, EST NON AFRICAN AMERICAN: 80 mL/min/{1.73_m2} (ref >59)
Glucose: 90 mg/dL (ref 65–99)
Potassium: 5 mmol/L (ref 3.5–5.2)
SODIUM: 145 mmol/L — AB (ref 134–144)

## 2017-10-03 LAB — TSH: TSH: 2.09 u[IU]/mL (ref 0.450–4.500)

## 2017-10-03 LAB — LIPID PANEL
Chol/HDL Ratio: 3.4 ratio (ref 0.0–4.4)
Cholesterol, Total: 158 mg/dL (ref 100–199)
HDL: 46 mg/dL (ref >39)
LDL Calculated: 91 mg/dL (ref 0–99)
Triglycerides: 103 mg/dL (ref 0–149)
VLDL Cholesterol Cal: 21 mg/dL (ref 5–40)

## 2017-10-03 LAB — HEPATIC FUNCTION PANEL
ALBUMIN: 4.2 g/dL (ref 3.5–5.5)
ALK PHOS: 130 IU/L — AB (ref 39–117)
ALT: 10 IU/L (ref 0–32)
AST: 14 IU/L (ref 0–40)
BILIRUBIN, DIRECT: 0.06 mg/dL (ref 0.00–0.40)
Bilirubin Total: <0.2 mg/dL (ref 0.0–1.2)
TOTAL PROTEIN: 6.6 g/dL (ref 6.0–8.5)

## 2017-10-05 ENCOUNTER — Encounter: Payer: Self-pay | Admitting: Family Medicine

## 2018-01-04 ENCOUNTER — Other Ambulatory Visit: Payer: Self-pay | Admitting: Family Medicine

## 2018-01-05 NOTE — Telephone Encounter (Signed)
May have this plus for refills 

## 2018-01-13 ENCOUNTER — Other Ambulatory Visit: Payer: Self-pay | Admitting: *Deleted

## 2018-01-13 ENCOUNTER — Telehealth: Payer: Self-pay | Admitting: Family Medicine

## 2018-01-13 MED ORDER — BUPROPION HCL ER (SR) 150 MG PO TB12: 150.0000 mg | ORAL_TABLET | Freq: Two times a day (BID) | ORAL | 5 refills | Status: DC

## 2018-01-13 NOTE — Telephone Encounter (Signed)
Refills sent to pharm

## 2018-01-13 NOTE — Telephone Encounter (Signed)
Pharmacy requesting refill on Bupropion HCL SR 150 MG; take one tablet by mouth twice a day.

## 2018-01-13 NOTE — Telephone Encounter (Signed)
Ok six  Mo

## 2018-04-17 ENCOUNTER — Ambulatory Visit (INDEPENDENT_AMBULATORY_CARE_PROVIDER_SITE_OTHER): Payer: 59

## 2018-04-17 DIAGNOSIS — Z23 Encounter for immunization: Secondary | ICD-10-CM

## 2018-10-15 ENCOUNTER — Encounter: Payer: Self-pay | Admitting: Gastroenterology

## 2019-06-03 ENCOUNTER — Encounter: Payer: Self-pay | Admitting: Family Medicine

## 2020-04-21 ENCOUNTER — Other Ambulatory Visit: Payer: Self-pay

## 2020-04-21 ENCOUNTER — Encounter (HOSPITAL_COMMUNITY): Payer: Self-pay | Admitting: Emergency Medicine

## 2020-04-21 ENCOUNTER — Emergency Department (HOSPITAL_COMMUNITY)
Admission: EM | Admit: 2020-04-21 | Discharge: 2020-04-21 | Disposition: A | Discharge status: Home / Self Care | Payer: Self-pay | Attending: Emergency Medicine | Admitting: Emergency Medicine

## 2020-04-21 DIAGNOSIS — F17210 Nicotine dependence, cigarettes, uncomplicated: Secondary | ICD-10-CM | POA: Insufficient documentation

## 2020-04-21 DIAGNOSIS — Z7982 Long term (current) use of aspirin: Secondary | ICD-10-CM | POA: Insufficient documentation

## 2020-04-21 DIAGNOSIS — B309 Viral conjunctivitis, unspecified: Secondary | ICD-10-CM | POA: Insufficient documentation

## 2020-04-21 DIAGNOSIS — Z8541 Personal history of malignant neoplasm of cervix uteri: Secondary | ICD-10-CM | POA: Insufficient documentation

## 2020-04-21 MED ORDER — POLYMYXIN B-TRIMETHOPRIM 10000-0.1 UNIT/ML-% OP SOLN
1.0000 [drp] | Freq: Once | OPHTHALMIC | Status: DC
  Filled 2020-04-21: qty 10

## 2020-04-21 MED ORDER — POLYMYXIN B-TRIMETHOPRIM 10000-0.1 UNIT/ML-% OP SOLN: 1.0000 [drp] | OPHTHALMIC | 0 refills | Status: AC

## 2020-04-21 NOTE — Discharge Instructions (Signed)
You were evaluated in the emergency department today for your right eye itching and drainage.  Your vital signs and physical exam are very reassuring. It appears you have a viral infection of the right eye called conjunctivitis.  You have been prescribed an antibiotic eyedrop called Polytrim.  Please use it as prescribed.  This eyedrop will not treat a viral infection, however will prevent development of a bacterial infection on top of a viral infection in your eye.  Return to the emergency department if you develop any blurry vision, double vision, pain in your eye, fevers, chills, or any other new severe symptoms.

## 2020-04-21 NOTE — ED Provider Notes (Signed)
[facility] EMERGENCY DEPARTMENT Provider Note   CSN: [id number] Arrival date & time: 04/21/20  1035     History Chief Complaint  Patient presents with  . eye swelling    Tara Wong is a 64 y.o. female who presents with concern for right eye itching, redness, and swelling of her eyelid since she woke this morning.  Endorses some watery drainage from the right eye, denies pain at rest or during movement of the eye.  She denies itching or redness or drainage from her left eye.  Denies blurry vision, double vision, dizziness, lightheadedness, headache.  She denies congestion, sore throat, shortness of breath, chest pain, nausea, vomiting, diarrhea. I personally read this patient medical records.  History of GERD, depression, urinary incontinence, hyperlipidemia, abdominal hysterectomy.  HPI     Past Medical History:  Diagnosis Date  . Cancer (HCC)    cervical  . GERD (gastroesophageal reflux disease)   . Hypercholesteremia     Patient Active Problem List   Diagnosis Date Noted  . Depression, major, single episode, moderate ([place]) 07/27/2016  . Rectal bleeding 12/25/2015  . Urinary incontinence 07/15/2012  . Other and unspecified hyperlipidemia 07/15/2012  . GERD (gastroesophageal reflux disease) 07/15/2012    Past Surgical History:  Procedure Laterality Date  . ABDOMINAL HYSTERECTOMY  07/1997   cervical cancer  . CERVICAL SPINE SURGERY    . CHOLECYSTECTOMY    . COLONOSCOPY  10/2008   Dr. Oneida Alar: moderate internal hemorrhoids, 3 mm hyperplastic rectal polyp  . COLONOSCOPY N/A 01/15/2016   Procedure: COLONOSCOPY;  Surgeon: Danie Binder, MD;  Location: [facility] ENDO SUITE;  Service: Endoscopy;  Laterality: N/A;  8:30 am  . HEMORRHOID SURGERY N/A 02/09/2016   Procedure: EXTENSIVE HEMORRHOIDECTOMY;  Surgeon: Vickie Epley, MD;  Location: [facility] ORS;  Service: General;  Laterality: N/A;  . ULNAR NERVE REPAIR Left      OB History   No obstetric history on file.      Family History  Problem Relation Age of Onset  . Kidney cancer Mother        breast  . Heart disease Father 6       MI  . Cancer Maternal Aunt        breast  . Cancer Maternal Grandmother        breast  . Colon cancer Neg Hx   . Colon polyps Neg Hx     Social History   Tobacco Use  . Smoking status: Current Every Day Smoker    Packs/day: 1.00    Years: 30.00    Pack years: 30.00    Types: Cigarettes  . Smokeless tobacco: Former Systems developer  . Tobacco comment: as of Aug 2017, 2 ppd but working on quitting   Vaping Use  . Vaping Use: Former  Substance Use Topics  . Alcohol use: No  . Drug use: No    Home Medications Prior to Admission medications   Medication Sig Start Date End Date Taking? Authorizing Provider  acetaminophen (TYLENOL) 500 MG tablet Take 1,000 mg by mouth every 6 (six) hours as needed for moderate pain.    [provider]  aspirin EC 81 MG tablet Take 81 mg by mouth daily.    [provider]  atorvastatin (LIPITOR) 20 MG tablet TAKE 1 TABLET BY MOUTH EVERYDAY AT BEDTIME 09/09/17   Mikey Kirschner, MD  buPROPion [facility] SR) 150 MG 12 hr tablet Take 1 tablet (150 mg total) by mouth 2 (two) times daily.  01/13/18   Mikey Kirschner, MD  HYDROcodone-acetaminophen (NORCO/VICODIN) 5-325 MG tablet Take 1 tablet by mouth every 6 (six) hours as needed for moderate pain. Patient not taking: Reported on 09/30/2017 09/17/16   Mikey Kirschner, MD  hydrocortisone (ANUSOL-HC) 2.5 % rectal cream Place 1 application rectally 2 (two) times daily. Patient not taking: Reported on 09/30/2017 12/25/15   Annitta Needs, NP  ibuprofen (ADVIL,MOTRIN) 200 MG tablet Take 200 mg by mouth every 6 (six) hours as needed for moderate pain.    [provider]  loratadine (CLARITIN) 10 MG tablet Take 10 mg by mouth daily.    [provider]  oseltamivir (TAMIFLU) 75 MG capsule One po BID for 5 days Patient not taking: Reported on 09/30/2017 05/09/17   Kathyrn Drown, MD  trimethoprim-polymyxin b (POLYTRIM) ophthalmic solution Place 1 drop into the right eye every 4 (four) hours for 5 days. 04/21/20 04/26/20  Gardenia Witter, Gypsy Balsam, PA-C    Allergies    Cefzil [cefprozil]  Review of Systems   Review of Systems  Constitutional: Negative for activity change, appetite change, chills, diaphoresis, fatigue and fever.  HENT: Negative.   Eyes: Positive for discharge, redness and itching. Negative for photophobia, pain and visual disturbance.  Respiratory: Negative.   Cardiovascular: Negative.   Gastrointestinal: Negative.   Genitourinary: Negative.   Musculoskeletal: Negative.   Neurological: Negative.     Physical Exam Updated Vital Signs BP (!) 152/92   Pulse 91   Temp 98.6 F (37 C) (Oral)   Resp 16   Ht 5\' 8"  (1.727 m)   Wt 74.8 kg   SpO2 98%   BMI 25.09 kg/m   Physical Exam Vitals and nursing note reviewed.  HENT:     Head: Normocephalic and atraumatic.     Right Ear: External ear normal.     Left Ear: External ear normal.     Nose: Nose normal.     Mouth/Throat:     Mouth: Mucous membranes are moist.     Pharynx: Oropharynx is clear. Uvula midline. No oropharyngeal exudate or posterior oropharyngeal erythema.  Eyes:     General: Vision grossly intact.        Right eye: No discharge.        Left eye: No discharge.     Extraocular Movements: Extraocular movements intact.     Conjunctiva/sclera:     Right eye: Right conjunctiva is injected.     Left eye: Left conjunctiva is not injected. No chemosis.    Pupils: Pupils are equal, round, and reactive to light.     Right eye: No fluorescein uptake. Seidel exam negative.     Slit lamp exam:    Right eye: Anterior chamber quiet. No corneal ulcer, foreign body or hyphema.   Cardiovascular:     Rate and Rhythm: Normal rate and regular rhythm.     Pulses: Normal pulses.  Pulmonary:     Effort: Pulmonary effort is normal. No respiratory distress.     Breath sounds: Normal  breath sounds. No wheezing or rales.  Abdominal:     General: Bowel sounds are normal. There is no distension.     Tenderness: There is no abdominal tenderness.  Musculoskeletal:        General: No deformity.     Cervical back: Neck supple.  Skin:    General: Skin is warm and dry.     Capillary Refill: Capillary refill takes less than 2 seconds.  Findings: No lesion or rash.  Neurological:     General: No focal deficit present.     Mental Status: She is alert and oriented to person, place, and time. Mental status is at baseline.  Psychiatric:        Mood and Affect: Mood normal.     ED Results / Procedures / Treatments   Labs (all labs ordered are listed, but only abnormal results are displayed) Labs Reviewed - No data to display  EKG None  Radiology No results found.  Procedures Procedures (including critical care time)  Medications Ordered in ED Medications - No data to display  ED Course  I have reviewed the triage vital signs and the nursing notes.  Pertinent labs & imaging results that were available during my care of the patient were reviewed by me and considered in my medical decision making (see chart for details).    MDM Rules/Calculators/A&P                         64 year old female with concern for right eye itching and wateriness since this morning.  History of seasonal allergies.  Differential diagnosis for this patient did not include but not limited to viral conjunctivitis, bacterial conjunctivitis, allergic conjunctivitis, herpes zoster ophthalmicus, herpes keratitis, caustic keratoconjunctivitis.   Hypertensive on intake 152/92.  Vital signs otherwise normal.  Physical exam very reassuring.  No evidence of corneal abrasion or Seidel sign on slit-lamp exam.  No skin changes suggestive of herpes zoster.  PERRL, EOMI.  Physical exam and HPI most consistent with viral conjunctivitis.  Will prescribe antibiotic eyedrop to prevent superimposed  bacterial infection in the eye.  Trenay voiced understanding of her medical evaluation and treatment plan.  Each of her questions were answered to her expressed satisfaction.  Return precautions were given.  Patient is well-appearing, stable, and appropriate for discharge at this time.  Final Clinical Impression(s) / ED Diagnoses Final diagnoses:  Viral conjunctivitis    Rx / DC Orders ED Discharge Orders         Ordered    trimethoprim-polymyxin b (POLYTRIM) ophthalmic solution  Every 4 hours        04/21/20 1138           Felissa Blouch, Gypsy Balsam, PA-C 04/21/20 1943    Hayden Rasmussen, MD 04/22/20 [phone]

## 2020-04-21 NOTE — ED Triage Notes (Signed)
Pt c/o rt eye swelling and itchiness.

## 2021-07-05 ENCOUNTER — Other Ambulatory Visit: Payer: Self-pay

## 2021-07-05 ENCOUNTER — Ambulatory Visit (INDEPENDENT_AMBULATORY_CARE_PROVIDER_SITE_OTHER): Payer: Self-pay | Admitting: Family Medicine

## 2021-07-05 VITALS — HR 101 | Temp 98.6°F | Wt 173.0 lb

## 2021-07-05 DIAGNOSIS — H00033 Abscess of eyelid right eye, unspecified eyelid: Secondary | ICD-10-CM

## 2021-07-05 MED ORDER — AMOXICILLIN-POT CLAVULANATE 875-125 MG PO TABS: ORAL_TABLET | ORAL | 0 refills | Status: AC

## 2021-07-05 MED ORDER — OLOPATADINE HCL 0.2 % OP SOLN: 1.0000 [drp] | Freq: Every evening | OPHTHALMIC | 2 refills | Status: AC | PRN

## 2021-07-05 NOTE — Progress Notes (Signed)
? ?  Subjective:  ? ? Patient ID: Tara Wong, female    DOB: 1955-12-01, 66 y.o.   MRN: [id number] ? ?HPI ? ?Patient says she woke up this morning with right eye swollen, eye is red, draining and itching. ?Patient relates the top of the eye is swollen tender it was draining as well denies wheezing or difficulty breathing no high fever sweats or chills ? ?Review of Systems ? ?   ?Objective:  ? Physical Exam ?Lungs clear hearts regular HEENT benign ?I do not find conjunctivitis but I do fine some orbital preseptal cellulitis of the eye ? ? ? ?   ?Assessment & Plan:  ?Recommend allergy drops for allergies ?OTC Allegra and Flonase ?In addition to this antibiotics twice daily for 7 days for the cellulitis of the eyelid ? ? ? ?

## 2022-01-06 ENCOUNTER — Encounter (HOSPITAL_COMMUNITY): Payer: Self-pay

## 2022-01-06 ENCOUNTER — Other Ambulatory Visit: Payer: Self-pay

## 2022-01-06 ENCOUNTER — Emergency Department (HOSPITAL_COMMUNITY): Payer: Self-pay

## 2022-01-06 ENCOUNTER — Emergency Department (HOSPITAL_COMMUNITY)
Admission: EM | Admit: 2022-01-06 | Discharge: 2022-01-06 | Disposition: A | Discharge status: Home / Self Care | Payer: Self-pay | Attending: Emergency Medicine | Admitting: Emergency Medicine

## 2022-01-06 DIAGNOSIS — M7989 Other specified soft tissue disorders: Secondary | ICD-10-CM | POA: Insufficient documentation

## 2022-01-06 DIAGNOSIS — X509XXA Other and unspecified overexertion or strenuous movements or postures, initial encounter: Secondary | ICD-10-CM | POA: Insufficient documentation

## 2022-01-06 DIAGNOSIS — Y92512 Supermarket, store or market as the place of occurrence of the external cause: Secondary | ICD-10-CM | POA: Insufficient documentation

## 2022-01-06 DIAGNOSIS — M25561 Pain in right knee: Secondary | ICD-10-CM

## 2022-01-06 DIAGNOSIS — G8911 Acute pain due to trauma: Secondary | ICD-10-CM | POA: Insufficient documentation

## 2022-01-06 DIAGNOSIS — Y9301 Activity, walking, marching and hiking: Secondary | ICD-10-CM | POA: Insufficient documentation

## 2022-01-06 DIAGNOSIS — M79604 Pain in right leg: Secondary | ICD-10-CM

## 2022-01-06 DIAGNOSIS — Z7982 Long term (current) use of aspirin: Secondary | ICD-10-CM | POA: Insufficient documentation

## 2022-01-06 MED ORDER — OXYCODONE-ACETAMINOPHEN 5-325 MG PO TABS
2.0000 | ORAL_TABLET | Freq: Once | ORAL | Status: AC
  Administered 2022-01-06: 2 via ORAL
  Filled 2022-01-06: qty 2

## 2022-01-06 MED ORDER — KETOROLAC TROMETHAMINE 60 MG/2ML IM SOLN
30.0000 mg | Freq: Once | INTRAMUSCULAR | Status: AC
  Administered 2022-01-06: 30 mg via INTRAMUSCULAR
  Filled 2022-01-06: qty 2

## 2022-01-06 MED ORDER — MELOXICAM 15 MG PO TABS: 15.0000 mg | ORAL_TABLET | Freq: Every day | ORAL | 0 refills | Status: AC

## 2022-01-06 NOTE — ED Provider Notes (Signed)
[facility] EMERGENCY DEPARTMENT Provider Note   CSN: [id number] Arrival date & time: 01/06/22  0405     History  Chief Complaint  Patient presents with   Knee Pain    Right knee    Tara Wong is a 66 y.o. female.  66 year old female presents ER today with right knee pain.  Patient states that she was walking through [place] the other day when she felt a pop in the back of her knee and then her right knee became painful and swollen.  She states that initially the pain got better with a heating pad and Tylenol.  She has some mild discomfort but then [place] around [place] and after that the pain got a lot worse.  She drove home and the pain got even significantly worse with worsening swelling.  She had some shoulder with that that she could not even get out of the car.  Had severe pain since that time.  She did plan to see urgent care but cannot sleep secondary to the pain.  No weird twisting, trauma.  No fevers, redness recent illnesses.  No history of the same.   Knee Pain      Home Medications Prior to Admission medications   Medication Sig Start Date End Date Taking? Authorizing Provider  meloxicam (MOBIC) 15 MG tablet Take 1 tablet (15 mg total) by mouth daily for 10 days. 01/06/22 01/16/22 Yes Tuleen Mandelbaum, Corene Cornea, MD  acetaminophen (TYLENOL) 500 MG tablet Take 1,000 mg by mouth every 6 (six) hours as needed for moderate pain.    [provider]  amoxicillin-clavulanate (AUGMENTIN) 875-125 MG tablet Take 1 tablet by mouth twice a day for 10 days. 07/05/21   Kathyrn Drown, MD  aspirin EC 81 MG tablet Take 81 mg by mouth daily.    [provider]  fexofenadine (ALLEGRA ALLERGY) 180 MG tablet Take 180 mg by mouth daily.    [provider]  hydrocortisone (ANUSOL-HC) 2.5 % rectal cream Place 1 application rectally 2 (two) times daily. Patient not taking: Reported on 09/30/2017 12/25/15   Annitta Needs, NP  ibuprofen (ADVIL,MOTRIN)  200 MG tablet Take 200 mg by mouth every 6 (six) hours as needed for moderate pain.    [provider]  Olopatadine HCl 0.2 % SOLN Apply 1 drop to eye at bedtime as needed. 07/05/21   Kathyrn Drown, MD      Allergies    Cefzil [cefprozil]    Review of Systems   Review of Systems  Physical Exam Updated Vital Signs BP 101/72   Pulse 99   Temp 98.8 F (37.1 C) (Oral)   Resp 17   Wt 78.5 kg   SpO2 98%   BMI 26.31 kg/m  Physical Exam Vitals and nursing note reviewed.  Constitutional:      Appearance: She is well-developed.  HENT:     Head: Normocephalic and atraumatic.  Eyes:     Pupils: Pupils are equal, round, and reactive to light.  Cardiovascular:     Rate and Rhythm: Normal rate and regular rhythm.  Pulmonary:     Effort: No respiratory distress.     Breath sounds: No stridor.  Abdominal:     General: Abdomen is flat. There is no distension.  Musculoskeletal:        General: Swelling (mild right supra and infrapatellar swelling without obvious effusion, also with RLE edema and prominent veins compared to left) present.  Cervical back: Normal range of motion.  Skin:    General: Skin is warm and dry.     Findings: No erythema.  Neurological:     General: No focal deficit present.     Mental Status: She is alert.     ED Results / Procedures / Treatments   Labs (all labs ordered are listed, but only abnormal results are displayed) Labs Reviewed - No data to display  EKG None  Radiology DG Knee 2 Views Right  Result Date: 01/06/2022 CLINICAL DATA:  Knee pain EXAM: RIGHT KNEE - 1-2 VIEW COMPARISON:  None Available. FINDINGS: No evidence of fracture, dislocation, or joint effusion. Subjective osteopenia. Chondrocalcinosis without erosion. IMPRESSION: No acute finding. Chondrocalcinosis. Electronically Signed   By: Jorje Guild M.D.   On: 01/06/2022 04:52    Procedures Procedures    Medications Ordered in ED Medications   oxyCODONE-acetaminophen (PERCOCET/ROXICET) 5-325 MG per tablet 2 tablet (2 tablets Oral Given 01/06/22 0514)  ketorolac (TORADOL) injection 30 mg (30 mg Intramuscular Given 01/06/22 0515)    ED Course/ Medical Decision Making/ A&P                           Medical Decision Making Amount and/or Complexity of Data Reviewed Radiology: ordered.  Risk Prescription drug management.  No evidence of septic arthritis or other inflammatory arthritis.  Suspect possible Baker's cyst versus DVT versus ligamentous/meniscus injury.  Will x-ray to make sure there is no pathologic fracture or bony lesions that could be causing it.  Otherwise will provide supportive care can come back and get a ultrasound to rule out DVT.  Advised a couple days of decreased weightbearing with the crutches and slowly coming back to normal.  She will use ice, anti-inflammatories and compression during that time as well.  If not improving in 5 to 7 days will need follow-up with PCP for likely MRI to evaluate for internal derangement.  R knee XR done and showed no obvious fracture, bony lesions or large effusion (independently viewed and interpreted by myself and radiology read reviewed).   Final Clinical Impression(s) / ED Diagnoses Final diagnoses:  Acute pain of right knee    Rx / DC Orders ED Discharge Orders          Ordered    US Venous Img Lower Unilateral Right        01/06/22 0506    meloxicam (MOBIC) 15 MG tablet  Daily        01/06/22 0507              Virlan Kempker, Corene Cornea, MD 01/06/22 [phone]

## 2022-01-06 NOTE — Discharge Instructions (Addendum)
Please follow up with you primary provider or orthopedic surgeon (if you have one) if you haven't had significant improvement within the next 7 days.

## 2022-01-06 NOTE — ED Triage Notes (Signed)
Pt reports walking in "[place] the other day" and her knee started hurting, says she used warm compresses and ibuprofen at home with improvement, then yesterday did a lot of walking at park in [place] and knee pan returned worse with swelling.

## 2023-11-14 ENCOUNTER — Encounter: Payer: Self-pay | Admitting: Advanced Practice Midwife
